# Patient Record
Sex: Female | Born: 1990 | Race: White | Hispanic: No | Marital: Single | Smoking: Never smoker
Health system: Southern US, Community
[De-identification: ages and names within clinical notes are randomized; demographics above are authoritative.]

## PROBLEM LIST (undated history)

## (undated) DIAGNOSIS — F32A Depression, unspecified: Secondary | ICD-10-CM

## (undated) DIAGNOSIS — F988 Other specified behavioral and emotional disorders with onset usually occurring in childhood and adolescence: Secondary | ICD-10-CM

## (undated) DIAGNOSIS — N289 Disorder of kidney and ureter, unspecified: Secondary | ICD-10-CM

## (undated) DIAGNOSIS — F419 Anxiety disorder, unspecified: Secondary | ICD-10-CM

## (undated) DIAGNOSIS — F329 Major depressive disorder, single episode, unspecified: Secondary | ICD-10-CM

## (undated) DIAGNOSIS — E079 Disorder of thyroid, unspecified: Secondary | ICD-10-CM

## (undated) HISTORY — PX: KIDNEY STONE SURGERY: SHX686

## (undated) HISTORY — PX: WISDOM TOOTH EXTRACTION: SHX21

## (undated) HISTORY — PX: CYSTOSCOPY KIDNEY W/ URETERAL GUIDE WIRE: SUR371

---
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
## 2016-02-02 ENCOUNTER — Emergency Department
Admission: EM | Admit: 2016-02-02 | Discharge: 2016-02-02 | Disposition: A | Discharge status: Home / Self Care | Payer: BLUE CROSS/BLUE SHIELD | Source: Home / Self Care | Attending: Family Medicine | Admitting: Family Medicine

## 2016-02-02 ENCOUNTER — Encounter: Payer: Self-pay | Admitting: Emergency Medicine

## 2016-02-02 DIAGNOSIS — N390 Urinary tract infection, site not specified: Secondary | ICD-10-CM | POA: Diagnosis not present

## 2016-02-02 DIAGNOSIS — M545 Low back pain, unspecified: Secondary | ICD-10-CM

## 2016-02-02 HISTORY — DX: Major depressive disorder, single episode, unspecified: F32.9

## 2016-02-02 HISTORY — DX: Depression, unspecified: F32.A

## 2016-02-02 LAB — POCT URINALYSIS DIP (MANUAL ENTRY)
BILIRUBIN UA: NEGATIVE
GLUCOSE UA: NEGATIVE
Ketones, POC UA: NEGATIVE
NITRITE UA: NEGATIVE
Protein Ur, POC: NEGATIVE
Spec Grav, UA: 1.02 (ref 1.005–1.03)
Urobilinogen, UA: NEGATIVE (ref 0–1)
pH, UA: 5.5 (ref 5–8)

## 2016-02-02 MED ORDER — NITROFURANTOIN MONOHYD MACRO 100 MG PO CAPS: 100.0000 mg | ORAL_CAPSULE | Freq: Two times a day (BID) | ORAL | Status: DC

## 2016-02-02 NOTE — ED Provider Notes (Signed)
CSN: [id number]     Arrival date & time 02/02/16  1914 History   First MD Initiated Contact with Patient 02/02/16 1937     Chief Complaint  Patient presents with  . Urinary Frequency      HPI Comments: Patient developed left lower back pain last night, with dysuria and frequency.  No fevers, chills, and sweats.  No abdominal pain.  Patient is a 25 y.o. female presenting with dysuria. The history is provided by the patient.  Dysuria Pain quality:  Aching Pain severity:  Mild Onset quality:  Sudden Duration:  1 day Timing:  Constant Progression:  Unchanged Chronicity:  New Relieved by:  Nothing Worsened by:  Nothing tried Ineffective treatments:  None tried Urinary symptoms: frequent urination and hesitancy   Urinary symptoms: no discolored urine, no foul-smelling urine, no hematuria and no bladder incontinence   Associated symptoms: no abdominal pain, no fever, no flank pain, no genital lesions, no nausea, no vaginal discharge and no vomiting     Past Medical History  Diagnosis Date  . Depressed    Past Surgical History  Procedure Laterality Date  . Kidney stone surgery     Family History  Problem Relation Age of Onset  . Diabetes Mother   . Hypertension Father    Social History  Substance Use Topics  . Smoking status: Never Smoker   . Smokeless tobacco: Never Used  . Alcohol Use: 1.8 oz/week    3 Glasses of wine per week   OB History    No data available     Review of Systems  Constitutional: Negative for fever.  Gastrointestinal: Negative for nausea, vomiting and abdominal pain.  Genitourinary: Positive for dysuria. Negative for flank pain and vaginal discharge.  Musculoskeletal: Positive for back pain.  Skin: Negative for rash.  All other systems reviewed and are negative.   Allergies  Penicillins and Septra  Home Medications   Prior to Admission medications   Medication Sig Start Date End Date Taking? Authorizing Provider  fexofenadine (ALLEGRA)  180 MG tablet Take 180 mg by mouth daily.   Yes Historical Provider, MD  levothyroxine (SYNTHROID, LEVOTHROID) 100 MCG tablet Take 100 mcg by mouth daily before breakfast.   Yes Historical Provider, MD  sertraline (ZOLOFT) 100 MG tablet Take 100 mg by mouth daily.   Yes Historical Provider, MD  nitrofurantoin, macrocrystal-monohydrate, (MACROBID) 100 MG capsule Take 1 capsule (100 mg total) by mouth 2 (two) times daily. Take with food. 02/02/16   Lattie Haw, MD   Meds Ordered and Administered this Visit  Medications - No data to display  BP 110/76 mmHg  Pulse 98  Temp(Src) 98.4 F (36.9 C) (Oral)  Wt 195 lb (88.451 kg)  SpO2 99% No data found.   Physical Exam  Constitutional: She is oriented to person, place, and time. She appears well-developed and well-nourished. No distress.  HENT:  Head: Normocephalic.  Nose: Nose normal.  Mouth/Throat: Oropharynx is clear and moist.  Eyes: Conjunctivae are normal. Pupils are equal, round, and reactive to light.  Neck: Neck supple.  Cardiovascular: Normal heart sounds.   Pulmonary/Chest: Breath sounds normal.  Abdominal: There is no tenderness. There is no CVA tenderness.  Musculoskeletal: She exhibits no edema.       Back:  Back is not tender to palpation; patient's area of pain is noted on diagram.  Lymphadenopathy:    She has no cervical adenopathy.  Neurological: She is alert and oriented to person, place, and time.  Skin: Skin is warm and dry. No rash noted.  Nursing note and vitals reviewed.   ED Course  Procedures none    Labs Reviewed  POCT URINALYSIS DIP (MANUAL ENTRY) - Abnormal; Notable for the following:    Blood, UA trace-intact (*)    Leukocytes, UA small (1+) (*)    All other components within normal limits  URINE CULTURE      MDM   1. Left-sided low back pain without sciatica   2. Urinary tract infection without hematuria, site unspecified    Urine culture pending. Begin Macrobid 100mg  BID for one  week. Increase fluid intake. If symptoms become significantly worse during the night or over the weekend, proceed to the local emergency room.  Followup with Family Doctor if not improved in one week.  May take Ibuprofen 200mg , 4 tabs every 8 hours with food for back ache. May use non-prescription AZO for about two days, if desired, to decrease urinary discomfort.     Lattie HawStephen A Ayham Word, MD 02/08/16 [phone]

## 2016-02-02 NOTE — Discharge Instructions (Signed)
May take Ibuprofen 200mg , 4 tabs every 8 hours with food for back ache. May use non-prescription AZO for about two days, if desired, to decrease urinary discomfort.  If symptoms become significantly worse during the night or over the weekend, proceed to the local emergency room.    Urinary Tract Infection Urinary tract infections (UTIs) can develop anywhere along your urinary tract. Your urinary tract is your body's drainage system for removing wastes and extra water. Your urinary tract includes two kidneys, two ureters, a bladder, and a urethra. Your kidneys are a pair of bean-shaped organs. Each kidney is about the size of your fist. They are located below your ribs, one on each side of your spine. CAUSES Infections are caused by microbes, which are microscopic organisms, including fungi, viruses, and bacteria. These organisms are so small that they can only be seen through a microscope. Bacteria are the microbes that most commonly cause UTIs. SYMPTOMS  Symptoms of UTIs may vary by age and gender of the patient and by the location of the infection. Symptoms in young women typically include a frequent and intense urge to urinate and a painful, burning feeling in the bladder or urethra during urination. Older women and men are more likely to be tired, shaky, and weak and have muscle aches and abdominal pain. A fever may mean the infection is in your kidneys. Other symptoms of a kidney infection include pain in your back or sides below the ribs, nausea, and vomiting. DIAGNOSIS To diagnose a UTI, your caregiver will ask you about your symptoms. Your caregiver will also ask you to provide a urine sample. The urine sample will be tested for bacteria and white blood cells. White blood cells are made by your body to help fight infection. TREATMENT  Typically, UTIs can be treated with medication. Because most UTIs are caused by a bacterial infection, they usually can be treated with the use of antibiotics.  The choice of antibiotic and length of treatment depend on your symptoms and the type of bacteria causing your infection. HOME CARE INSTRUCTIONS  If you were prescribed antibiotics, take them exactly as your caregiver instructs you. Finish the medication even if you feel better after you have only taken some of the medication.  Drink enough water and fluids to keep your urine clear or pale yellow.  Avoid caffeine, tea, and carbonated beverages. They tend to irritate your bladder.  Empty your bladder often. Avoid holding urine for long periods of time.  Empty your bladder before and after sexual intercourse.  After a bowel movement, women should cleanse from front to back. Use each tissue only once. SEEK MEDICAL CARE IF:   You have back pain.  You develop a fever.  Your symptoms do not begin to resolve within 3 days. SEEK IMMEDIATE MEDICAL CARE IF:   You have severe back pain or lower abdominal pain.  You develop chills.  You have nausea or vomiting.  You have continued burning or discomfort with urination. MAKE SURE YOU:   Understand these instructions.  Will watch your condition.  Will get help right away if you are not doing well or get worse.   This information is not intended to replace advice given to you by your health care provider. Make sure you discuss any questions you have with your health care provider.   Document Released: 06/05/2005 Document Revised: 05/17/2015 Document Reviewed: 10/04/2011 Elsevier Interactive Patient Education [employer]2016 Elsevier Inc.

## 2016-02-02 NOTE — ED Notes (Signed)
Pt c/o dysuria, frequency, and low back pain that started last night. Denies fever.

## 2016-02-04 LAB — URINE CULTURE
Colony Count: NO GROWTH
Organism ID, Bacteria: NO GROWTH

## 2016-02-05 ENCOUNTER — Telehealth: Payer: Self-pay | Admitting: Emergency Medicine

## 2016-02-05 NOTE — ED Notes (Signed)
Pt informed of negative urine culture, states that she is doing much better.  Pt will f/u as scheduled.  TMartin,CMA

## 2016-06-12 LAB — BASIC METABOLIC PANEL
BUN: 11 mg/dL (ref 4–21)
GLUCOSE: 88 mg/dL
Potassium: 4.1 mmol/L (ref 3.4–5.3)
Sodium: 143 mmol/L (ref 137–147)

## 2016-06-12 LAB — HEPATIC FUNCTION PANEL
ALT: 28 U/L (ref 7–35)
AST: 21 U/L (ref 13–35)
Alkaline Phosphatase: 100 U/L (ref 25–125)
Bilirubin, Total: 0.7 mg/dL

## 2016-06-14 ENCOUNTER — Emergency Department
Admission: EM | Admit: 2016-06-14 | Discharge: 2016-06-14 | Disposition: A | Discharge status: Home / Self Care | Payer: BLUE CROSS/BLUE SHIELD | Source: Home / Self Care | Attending: Family Medicine | Admitting: Family Medicine

## 2016-06-14 DIAGNOSIS — J029 Acute pharyngitis, unspecified: Secondary | ICD-10-CM

## 2016-06-14 LAB — POCT RAPID STREP A (OFFICE): Rapid Strep A Screen: NEGATIVE

## 2016-06-14 NOTE — ED Triage Notes (Signed)
Pt started with a sore throat last evening.  During the night she kept waking up with pain.  It has hurt most of the day today.  Had advil at 7:30 this morning and again at noon.

## 2016-06-14 NOTE — Discharge Instructions (Signed)

## 2016-06-14 NOTE — ED Provider Notes (Signed)
CSN: [id number]     Arrival date & time 06/14/16  1624 History   First MD Initiated Contact with Patient 06/14/16 1645     Chief Complaint  Patient presents with  . Sore Throat   (Consider location/radiation/quality/duration/timing/severity/associated sxs/prior Treatment) HPI  Lydia Gregory is a 25 y.o. female presenting to UC with c/o sore throat that started last night and kept waking her up throughout the night due to the pain.  Pain is aching, 4/10 at this time.  Worse with swallowing. 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  She took Advil at 7:30AM and again at noon today. Denies fever, chills, n/v/d, cough or congestion. No known sick contacts.  Past Medical History:  Diagnosis Date  . Depressed    Past Surgical History:  Procedure Laterality Date  . KIDNEY STONE SURGERY     Family History  Problem Relation Age of Onset  . Diabetes Mother   . Hypertension Mother   . Hypertension Father    Social History  Substance Use Topics  . Smoking status: Never Smoker  . Smokeless tobacco: Never Used  . Alcohol use 1.8 oz/week    3 Glasses of wine per week   OB History    No data available     Review of Systems  Constitutional: Negative for chills and fever.  HENT: Positive for sore throat. Negative for congestion, ear pain, trouble swallowing and voice change.   Respiratory: Negative for cough and shortness of breath.   Cardiovascular: Negative for chest pain and palpitations.  Gastrointestinal: Negative for abdominal pain, diarrhea, nausea and vomiting.  Musculoskeletal: Negative for arthralgias, back pain and myalgias.  Skin: Negative for rash.  Neurological: Positive for headaches. Negative for dizziness and light-headedness.    Allergies  Amoxicillin; Penicillins; and Septra [sulfamethoxazole-trimethoprim]  Home Medications   Prior to Admission medications   Medication Sig Start  Date End Date Taking? Authorizing Provider  fexofenadine (ALLEGRA) 180 MG tablet Take 180 mg by mouth daily.    Historical Provider, MD  levothyroxine (SYNTHROID, LEVOTHROID) 100 MCG tablet Take 100 mcg by mouth daily before breakfast.    Historical Provider, MD  nitrofurantoin, macrocrystal-monohydrate, (MACROBID) 100 MG capsule Take 1 capsule (100 mg total) by mouth 2 (two) times daily. Take with food. 02/02/16   Lattie HawStephen A Beese, MD  sertraline (ZOLOFT) 100 MG tablet Take 100 mg by mouth daily.    Historical Provider, MD   Meds Ordered and Administered this Visit  Medications - No data to display  BP 105/74 (BP Location: Left Arm)   Pulse 77   Temp 98 F (36.7 C) (Oral)   Ht 5\' 4"  (1.626 m)   Wt 187 lb (84.8 kg)   SpO2 99%   BMI 32.10 kg/m  No data found.   Physical Exam  Constitutional: She appears well-developed and well-nourished. No distress.  HENT:  Head: Normocephalic and atraumatic.  Right Ear: Tympanic membrane normal.  Left Ear: Tympanic membrane normal.  Nose: Nose normal.  Mouth/Throat: Uvula is midline and mucous membranes are normal. Posterior oropharyngeal erythema present.  Eyes: Conjunctivae are normal. No scleral icterus.  Neck: Normal range of motion.  Cardiovascular: Normal rate, regular rhythm and normal heart sounds.   Pulmonary/Chest: Effort normal and breath sounds normal. No stridor. No respiratory distress. She has no wheezes. She has no rales.  Musculoskeletal: Normal range of motion.  Lymphadenopathy:    She has no cervical adenopathy.  Neurological: She is alert.  Skin: Skin is warm and dry. She is not diaphoretic.  Nursing note and vitals reviewed.   Urgent Care Course   Clinical Course    Procedures (including critical care time)  Labs Review Labs Reviewed  STREP A DNA PROBE  POCT RAPID STREP A (OFFICE)    Imaging Review No results found.   MDM   1. Acute pharyngitis, unspecified etiology    Pt c/o sore throat that started  late last night, mild generalized headache as well. No other symptoms. Pt is a Runner, broadcasting/film/video.  Rapid strep: NEGATIVE Culture sent.  Symptoms likely viral. Encouraged fluids, rest, acetaminophen and ibuprofen. F/u with PCP in 7-10 days if not improving, sooner if worsening. Pt will be notified if Positive culture.    Junius Finner, PA-C 06/14/16 [phone]

## 2016-06-15 ENCOUNTER — Telehealth: Payer: Self-pay | Admitting: Emergency Medicine

## 2016-06-15 LAB — STREP A DNA PROBE: GASP: NOT DETECTED

## 2016-06-15 NOTE — Telephone Encounter (Signed)
Called.

## 2016-06-15 NOTE — Telephone Encounter (Signed)
Unable to leave a message on voice mail results were negative

## 2016-08-20 ENCOUNTER — Encounter: Payer: Self-pay | Admitting: Physician Assistant

## 2016-08-20 ENCOUNTER — Ambulatory Visit (INDEPENDENT_AMBULATORY_CARE_PROVIDER_SITE_OTHER): Payer: BLUE CROSS/BLUE SHIELD | Admitting: Physician Assistant

## 2016-08-20 VITALS — BP 129/83 | HR 97 | Ht 64.0 in | Wt 196.0 lb

## 2016-08-20 DIAGNOSIS — E039 Hypothyroidism, unspecified: Secondary | ICD-10-CM

## 2016-08-20 DIAGNOSIS — Z6833 Body mass index (BMI) 33.0-33.9, adult: Secondary | ICD-10-CM

## 2016-08-20 DIAGNOSIS — Z13220 Encounter for screening for lipoid disorders: Secondary | ICD-10-CM

## 2016-08-20 DIAGNOSIS — E559 Vitamin D deficiency, unspecified: Secondary | ICD-10-CM | POA: Insufficient documentation

## 2016-08-20 DIAGNOSIS — F411 Generalized anxiety disorder: Secondary | ICD-10-CM

## 2016-08-20 DIAGNOSIS — Z131 Encounter for screening for diabetes mellitus: Secondary | ICD-10-CM

## 2016-08-20 DIAGNOSIS — E6609 Other obesity due to excess calories: Secondary | ICD-10-CM | POA: Insufficient documentation

## 2016-08-20 DIAGNOSIS — F331 Major depressive disorder, recurrent, moderate: Secondary | ICD-10-CM

## 2016-08-20 MED ORDER — BUSPIRONE HCL 5 MG PO TABS: 5.0000 mg | ORAL_TABLET | Freq: Two times a day (BID) | ORAL | 0 refills | Status: DC

## 2016-08-20 MED ORDER — ALPRAZOLAM 0.5 MG PO TABS: 0.5000 mg | ORAL_TABLET | Freq: Two times a day (BID) | ORAL | 2 refills | Status: DC | PRN

## 2016-08-20 MED ORDER — LORCASERIN HCL 10 MG PO TABS: ORAL_TABLET | ORAL | 1 refills | Status: DC

## 2016-08-20 NOTE — Progress Notes (Signed)
Subjective:    Patient ID: Lydia Gregory, female    DOB: 11/29/1990, 25 y.o.   MRN: [id number]  HPI Pt is a 25 yo female who presents to establish care.   .. Active Ambulatory Problems    Diagnosis Date Noted  . Class 1 obesity due to excess calories without serious comorbidity with body mass index (BMI) of 33.0 to 33.9 in adult 08/20/2016  . Vitamin D deficiency 08/20/2016  . MDD (major depressive disorder), recurrent episode, moderate (HCC) 08/20/2016  . GAD (generalized anxiety disorder) 08/20/2016   Resolved Ambulatory Problems    Diagnosis Date Noted  . No Resolved Ambulatory Problems   Past Medical History:  Diagnosis Date  . Depressed    .Marland Kitchen. Family History  Problem Relation Age of Onset  . Diabetes Mother   . Hypertension Mother   . Hypertension Father    .Marland Kitchen. Social History   Social History  . Marital status: Single    Spouse name: N/A  . Number of children: N/A  . Years of education: N/A   Occupational History  . Not on file.   Social History Main Topics  . Smoking status: Never Smoker  . Smokeless tobacco: Never Used  . Alcohol use 1.8 oz/week    3 Glasses of wine per week  . Drug use: No  . Sexual activity: Not on file   Other Topics Concern  . Not on file   Social History Narrative  . 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 She feels like zoloft helps a lot but still not controlling anxiety. She denies any feelings of helplessness or hopelessness currently. She is not exercising or eating right. She would like to lose weight.    Review of Systems  All other systems reviewed and are negative.      Objective:   Physical Exam  Constitutional: She is oriented to person, place, and time. She appears well-developed and well-nourished.  Obesity.   HENT:  Head: Normocephalic and atraumatic.  Neck: Normal range of motion. Neck supple.  Cardiovascular: Normal rate, regular rhythm and  normal heart sounds.   Pulmonary/Chest: Effort normal and breath sounds normal.  Lymphadenopathy:    She has no cervical adenopathy.  Neurological: She is alert and oriented to person, place, and time.  Psychiatric: She has a normal mood and affect. Her behavior is normal.          Assessment & Plan:  Marland Kitchen.Marland Kitchen.Whitney PostLogan was seen today for establish care, hypothyroidism, depression and anxiety.  Diagnoses and all orders for this visit:  Hypothyroidism, unspecified type -     TSH  Vitamin D deficiency -     VITAMIN D 25 Hydroxy (Vit-D Deficiency, Fractures)  Class 1 obesity due to excess calories without serious comorbidity with body mass index (BMI) of 33.0 to 33.9 in adult -     Lorcaserin HCl 10 MG TABS; Take one tablet twice a day. -     Lipid panel -     COMPLETE METABOLIC PANEL WITH GFR  Screening for diabetes mellitus -     Lipid panel  Screening for lipid disorders -     COMPLETE METABOLIC PANEL WITH GFR  MDD (major depressive disorder), recurrent episode, moderate (HCC) -     busPIRone (BUSPAR) 5 MG tablet; Take 1 tablet (5 mg total) by mouth 2 (two) times daily.  GAD (generalized anxiety disorder) -     ALPRAZolam (  XANAX) 0.5 MG tablet; Take 1 tablet (0.5 mg total) by mouth 2 (two) times daily as needed for anxiety. -     busPIRone (BUSPAR) 5 MG tablet; Take 1 tablet (5 mg total) by mouth 2 (two) times daily.   PHQ-9 was 15. GAD-7 was 17.   Started buspar twice a day.  Xanax only has needed.  Continue on zoloft.   Phentermine is not an option due to issue with anxiety.  belviq started and side effects discussed. Follow up in 2 months.  Encouraged 150 minutes of exercise a week and 1500 calorie diet.

## 2016-08-22 ENCOUNTER — Telehealth: Payer: Self-pay | Admitting: *Deleted

## 2016-08-22 NOTE — Telephone Encounter (Signed)
Belviq PA submitted through [id number] - Rx #: [id number]

## 2016-08-27 NOTE — Telephone Encounter (Signed)
Received fax prior auth for Belviq needing more information I filled out form and faxed it back - CF

## 2016-08-27 NOTE — Telephone Encounter (Signed)
[facility] request for Belviq due to not meeting the requirements they did however deny this request before they received the faxed information so determination may be changed - CF

## 2016-09-15 ENCOUNTER — Other Ambulatory Visit: Payer: Self-pay | Admitting: Physician Assistant

## 2016-09-15 DIAGNOSIS — F331 Major depressive disorder, recurrent, moderate: Secondary | ICD-10-CM

## 2016-09-15 DIAGNOSIS — F411 Generalized anxiety disorder: Secondary | ICD-10-CM

## 2016-11-30 ENCOUNTER — Other Ambulatory Visit: Payer: Self-pay | Admitting: Physician Assistant

## 2016-11-30 DIAGNOSIS — F331 Major depressive disorder, recurrent, moderate: Secondary | ICD-10-CM

## 2016-11-30 DIAGNOSIS — F411 Generalized anxiety disorder: Secondary | ICD-10-CM

## 2016-12-31 ENCOUNTER — Ambulatory Visit (INDEPENDENT_AMBULATORY_CARE_PROVIDER_SITE_OTHER): Payer: BC Managed Care – PPO | Admitting: Physician Assistant

## 2016-12-31 ENCOUNTER — Encounter: Payer: Self-pay | Admitting: Physician Assistant

## 2016-12-31 VITALS — BP 111/74 | HR 78 | Ht 64.0 in | Wt 200.0 lb

## 2016-12-31 DIAGNOSIS — E039 Hypothyroidism, unspecified: Secondary | ICD-10-CM | POA: Diagnosis not present

## 2016-12-31 DIAGNOSIS — Z6833 Body mass index (BMI) 33.0-33.9, adult: Secondary | ICD-10-CM

## 2016-12-31 DIAGNOSIS — R41840 Attention and concentration deficit: Secondary | ICD-10-CM

## 2016-12-31 DIAGNOSIS — F411 Generalized anxiety disorder: Secondary | ICD-10-CM

## 2016-12-31 DIAGNOSIS — F331 Major depressive disorder, recurrent, moderate: Secondary | ICD-10-CM | POA: Diagnosis not present

## 2016-12-31 DIAGNOSIS — E6609 Other obesity due to excess calories: Secondary | ICD-10-CM | POA: Diagnosis not present

## 2016-12-31 DIAGNOSIS — F900 Attention-deficit hyperactivity disorder, predominantly inattentive type: Secondary | ICD-10-CM | POA: Diagnosis not present

## 2016-12-31 DIAGNOSIS — R5383 Other fatigue: Secondary | ICD-10-CM

## 2016-12-31 MED ORDER — LORCASERIN HCL 10 MG PO TABS: 1.0000 | ORAL_TABLET | Freq: Two times a day (BID) | ORAL | 1 refills | Status: DC

## 2016-12-31 MED ORDER — LEVOTHYROXINE SODIUM 150 MCG PO TABS: 150.0000 ug | ORAL_TABLET | Freq: Every day | ORAL | 5 refills | Status: DC

## 2016-12-31 MED ORDER — SERTRALINE HCL 100 MG PO TABS
100.0000 mg | ORAL_TABLET | Freq: Every day | ORAL | 5 refills | Status: DC
Start: 2016-12-31 — End: 2017-04-21

## 2016-12-31 MED ORDER — AMPHETAMINE-DEXTROAMPHET ER 20 MG PO CP24: 20.0000 mg | ORAL_CAPSULE | ORAL | 0 refills | Status: DC

## 2016-12-31 NOTE — Progress Notes (Signed)
   Subjective:    Patient ID: Lydia Gregory, female    DOB: Jan 02, 1991, 26 y.o.   MRN: [id number]  HPI Pt is a 26 yo obese female who presents to the clinic to follow up on weight. She was not able to get the belviq because not approved by insurance. She reports she is doing weight watchers and exercising 4-5 times a week with no results. infact she has gained 4lbs in 3 months. She is very frustrated. She does have a strong family history of thyroid issues in her family and she is on levothyroxine.   She is also frustrated with her inattention. Per patient she was on stimulant medication in high school and college but then stopped. She has formal testing done. 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 She feels like it wears her out during the day to have to think so much. She really struggles with energy. She sleeps well at night and wakes up feeling rested.    Review of Systems  All other systems reviewed and are negative.      Objective:   Physical Exam  Constitutional: She is oriented to person, place, and time. She appears well-developed and well-nourished.  Obese.   HENT:  Head: Normocephalic and atraumatic.  Neck: Normal range of motion. Neck supple. No thyromegaly present.  Cardiovascular: Normal rate, regular rhythm and normal heart sounds.   Pulmonary/Chest: Effort normal and breath sounds normal. She has no wheezes.  Neurological: She is alert and oriented to person, place, and time.  Psychiatric: She has a normal mood and affect. Her behavior is normal.          Assessment & Plan:  Marland KitchenMarland KitchenDiagnoses and all orders for this visit:  GAD (generalized anxiety disorder) -     sertraline (ZOLOFT) 100 MG tablet; Take 1 tablet (100 mg total) by mouth daily.  No energy -     CBC -     Comprehensive metabolic panel -     C-reactive protein -     Ferritin -     Sedimentation rate -     TSH -     Vitamin B12 -      Vit D  25 hydroxy (rtn osteoporosis monitoring)  MDD (major depressive disorder), recurrent episode, moderate (HCC) -     sertraline (ZOLOFT) 100 MG tablet; Take 1 tablet (100 mg total) by mouth daily.  Hypothyroidism, unspecified type -     levothyroxine (SYNTHROID, LEVOTHROID) 150 MCG tablet; Take 1 tablet (150 mcg total) by mouth daily.  Inattention  Attention deficit hyperactivity disorder (ADHD), predominantly inattentive type -     amphetamine-dextroamphetamine (ADDERALL XR) 20 MG 24 hr capsule; Take 1 capsule (20 mg total) by mouth every morning.  Class 1 obesity due to excess calories without serious comorbidity with body mass index (BMI) of 33.0 to 33.9 in adult -     Lorcaserin HCl 10 MG TABS; Take 1 tablet by mouth 2 (two) times daily.     Tried another 3 months of diet and exercise weight watces 3-4 times a week. Gained 4lbs. Will attempt to get belviq approved.   Will start adderall. Follow up in one month. Try to find copy of formal testing.   Will make sure with labs no metabolic reason for fatigue.

## 2017-01-02 ENCOUNTER — Telehealth: Payer: Self-pay | Admitting: *Deleted

## 2017-01-02 ENCOUNTER — Telehealth: Payer: Self-pay | Admitting: Physician Assistant

## 2017-01-02 ENCOUNTER — Encounter: Payer: Self-pay | Admitting: Physician Assistant

## 2017-01-02 DIAGNOSIS — R41840 Attention and concentration deficit: Secondary | ICD-10-CM | POA: Insufficient documentation

## 2017-01-02 DIAGNOSIS — E039 Hypothyroidism, unspecified: Secondary | ICD-10-CM | POA: Insufficient documentation

## 2017-01-02 DIAGNOSIS — R5383 Other fatigue: Secondary | ICD-10-CM | POA: Insufficient documentation

## 2017-01-02 DIAGNOSIS — F900 Attention-deficit hyperactivity disorder, predominantly inattentive type: Secondary | ICD-10-CM | POA: Insufficient documentation

## 2017-01-02 NOTE — Telephone Encounter (Signed)
Can we submit new information for belviq to see if it could be covered. She is not a candidate for phentermine due to her anxiety and she has gained 4lbs in 3 months with weight watchers and exericsing 4-5 times a week for at least 30 minutes.

## 2017-01-02 NOTE — Telephone Encounter (Signed)
Pre Authorization sent to cover my meds.[id number]

## 2017-01-02 NOTE — Telephone Encounter (Signed)
See note 01/02/17

## 2017-01-07 NOTE — Telephone Encounter (Signed)
Received fax from [facility] and Sharyn Creamer is approved from 01/02/17 - 04/03/17 as long as member stays on health plan.   PA# [facility] Plan [id number] Non-Grandfathered [id number] CL

## 2017-01-10 ENCOUNTER — Other Ambulatory Visit: Payer: Self-pay | Admitting: Physician Assistant

## 2017-01-10 DIAGNOSIS — F411 Generalized anxiety disorder: Secondary | ICD-10-CM

## 2017-01-10 DIAGNOSIS — F331 Major depressive disorder, recurrent, moderate: Secondary | ICD-10-CM

## 2017-02-06 ENCOUNTER — Other Ambulatory Visit: Payer: Self-pay | Admitting: *Deleted

## 2017-02-06 DIAGNOSIS — F900 Attention-deficit hyperactivity disorder, predominantly inattentive type: Secondary | ICD-10-CM

## 2017-02-06 MED ORDER — AMPHETAMINE-DEXTROAMPHET ER 20 MG PO CP24: 20.0000 mg | ORAL_CAPSULE | ORAL | 0 refills | Status: DC

## 2017-02-10 ENCOUNTER — Other Ambulatory Visit: Payer: Self-pay | Admitting: Physician Assistant

## 2017-02-10 DIAGNOSIS — F411 Generalized anxiety disorder: Secondary | ICD-10-CM

## 2017-02-10 DIAGNOSIS — F331 Major depressive disorder, recurrent, moderate: Secondary | ICD-10-CM

## 2017-03-24 LAB — COMPREHENSIVE METABOLIC PANEL
ALT: 19 U/L (ref 6–29)
AST: 14 U/L (ref 10–30)
Albumin: 4.2 g/dL (ref 3.6–5.1)
Alkaline Phosphatase: 78 U/L (ref 33–115)
BUN: 11 mg/dL (ref 7–25)
CHLORIDE: 107 mmol/L (ref 98–110)
CO2: 23 mmol/L (ref 20–31)
Calcium: 9.1 mg/dL (ref 8.6–10.2)
Creat: 0.75 mg/dL (ref 0.50–1.10)
GLUCOSE: 86 mg/dL (ref 65–99)
Potassium: 4.2 mmol/L (ref 3.5–5.3)
Sodium: 141 mmol/L (ref 135–146)
Total Bilirubin: 0.7 mg/dL (ref 0.2–1.2)
Total Protein: 6.4 g/dL (ref 6.1–8.1)

## 2017-03-24 LAB — CBC
HCT: 37.6 % (ref 35.0–45.0)
Hemoglobin: 12.4 g/dL (ref 11.7–15.5)
MCH: 29.7 pg (ref 27.0–33.0)
MCHC: 33 g/dL (ref 32.0–36.0)
MCV: 90 fL (ref 80.0–100.0)
MPV: 9.7 fL (ref 7.5–12.5)
PLATELETS: 241 10*3/uL (ref 140–400)
RBC: 4.18 MIL/uL (ref 3.80–5.10)
RDW: 13.7 % (ref 11.0–15.0)
WBC: 8.3 10*3/uL (ref 3.8–10.8)

## 2017-03-24 LAB — TSH: TSH: 5.58 mIU/L — ABNORMAL HIGH

## 2017-03-25 LAB — FERRITIN: Ferritin: 33 ng/mL (ref 10–154)

## 2017-03-25 LAB — VITAMIN B12: VITAMIN B 12: 341 pg/mL (ref 200–1100)

## 2017-03-25 LAB — C-REACTIVE PROTEIN: CRP: 1.8 mg/L (ref <8.0)

## 2017-03-25 LAB — SEDIMENTATION RATE: Sed Rate: 8 mm/hr (ref 0–20)

## 2017-03-25 LAB — VITAMIN D 25 HYDROXY (VIT D DEFICIENCY, FRACTURES): Vit D, 25-Hydroxy: 24 ng/mL — ABNORMAL LOW (ref 30–100)

## 2017-03-27 ENCOUNTER — Other Ambulatory Visit: Payer: Self-pay

## 2017-03-27 DIAGNOSIS — E039 Hypothyroidism, unspecified: Secondary | ICD-10-CM

## 2017-03-27 MED ORDER — VITAMIN D (ERGOCALCIFEROL) 1.25 MG (50000 UNIT) PO CAPS: 50000.0000 [IU] | ORAL_CAPSULE | ORAL | 0 refills | Status: DC

## 2017-03-27 MED ORDER — LEVOTHYROXINE SODIUM 175 MCG PO TABS: 175.0000 ug | ORAL_TABLET | Freq: Every day | ORAL | 3 refills | Status: DC

## 2017-03-27 NOTE — Addendum Note (Signed)
Addended by: Monica BectonHEKKEKANDAM, THOMAS J on: 03/27/2017 04:39 PM   Modules accepted: Orders

## 2017-03-27 NOTE — Assessment & Plan Note (Signed)
Vitamin D is low, TSH is elevated, calling in supplemental vitamin D, we also need to increase levothyroxine dose.  Recheck TSH in 6 weeks.

## 2017-04-14 ENCOUNTER — Encounter: Payer: Self-pay | Admitting: Physician Assistant

## 2017-04-21 ENCOUNTER — Encounter: Payer: Self-pay | Admitting: Physician Assistant

## 2017-04-21 ENCOUNTER — Ambulatory Visit (INDEPENDENT_AMBULATORY_CARE_PROVIDER_SITE_OTHER): Payer: BC Managed Care – PPO | Admitting: Physician Assistant

## 2017-04-21 VITALS — BP 117/84 | HR 101 | Ht 64.0 in | Wt 200.0 lb

## 2017-04-21 DIAGNOSIS — E6609 Other obesity due to excess calories: Secondary | ICD-10-CM | POA: Diagnosis not present

## 2017-04-21 DIAGNOSIS — F411 Generalized anxiety disorder: Secondary | ICD-10-CM | POA: Diagnosis not present

## 2017-04-21 DIAGNOSIS — F900 Attention-deficit hyperactivity disorder, predominantly inattentive type: Secondary | ICD-10-CM | POA: Diagnosis not present

## 2017-04-21 DIAGNOSIS — Z6833 Body mass index (BMI) 33.0-33.9, adult: Secondary | ICD-10-CM

## 2017-04-21 MED ORDER — SERTRALINE HCL 100 MG PO TABS: 200.0000 mg | ORAL_TABLET | Freq: Every day | ORAL | 5 refills | Status: DC

## 2017-04-21 MED ORDER — ALPRAZOLAM 0.5 MG PO TABS: 0.5000 mg | ORAL_TABLET | Freq: Two times a day (BID) | ORAL | 2 refills | Status: DC | PRN

## 2017-04-21 MED ORDER — AMPHETAMINE-DEXTROAMPHET ER 20 MG PO CP24: 20.0000 mg | ORAL_CAPSULE | ORAL | 0 refills | Status: DC

## 2017-04-21 NOTE — Progress Notes (Signed)
Subjective:    Patient ID: Lydia Gregory, female    DOB: [birth year], 26 y.o.   MRN: [id number]  HPI Pt is a 26 yo female who presents to the clinic for 3 month follow up.   Obesity- she stopped belviq because she was not watching what she eats or working out. She did not really feel like it was helping.   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 It helps her to stay productive and to decrease stress because she is getting work done.   GAd- much better this summer. She needs refill on zoloft. Doing well with no complaints.   .. Active Ambulatory Problems    Diagnosis Date Noted  . Class 1 obesity due to excess calories without serious comorbidity with body mass index (BMI) of 33.0 to 33.9 in adult 08/20/2016  . Vitamin D deficiency 08/20/2016  . MDD (major depressive disorder), recurrent episode, moderate (HCC) 08/20/2016  . GAD (generalized anxiety disorder) 08/20/2016  . No energy 01/02/2017  . Hypothyroidism 01/02/2017  . Inattention 01/02/2017  . Attention deficit hyperactivity disorder (ADHD), predominantly inattentive type 01/02/2017   Resolved Ambulatory Problems    Diagnosis Date Noted  . No Resolved Ambulatory Problems   Past Medical History:  Diagnosis Date  . Depressed        Review of Systems  All other systems reviewed and are negative.      Objective:   Physical Exam  Constitutional: She is oriented to person, place, and time. She appears well-developed and well-nourished.  HENT:  Head: Normocephalic and atraumatic.  Cardiovascular: Normal rate, regular rhythm and normal heart sounds.   Pulmonary/Chest: Effort normal and breath sounds normal.  Neurological: She is alert and oriented to person, place, and time.  Psychiatric: She has a normal mood and affect. Her behavior is normal.          Assessment & Plan:  Marland KitchenMarland KitchenKianni was seen today for follow-up.  Diagnoses and all orders for this visit:  Class 1 obesity  due to excess calories without serious comorbidity with body mass index (BMI) of 33.0 to 33.9 in adult  Attention deficit hyperactivity disorder (ADHD), predominantly inattentive type -     Discontinue: amphetamine-dextroamphetamine (ADDERALL XR) 20 MG 24 hr capsule; Take 1 capsule (20 mg total) by mouth every morning. -     Discontinue: amphetamine-dextroamphetamine (ADDERALL XR) 20 MG 24 hr capsule; Take 1 capsule (20 mg total) by mouth every morning. -     amphetamine-dextroamphetamine (ADDERALL XR) 20 MG 24 hr capsule; Take 1 capsule (20 mg total) by mouth every morning.  GAD (generalized anxiety disorder) -     ALPRAZolam (XANAX) 0.5 MG tablet; Take 1 tablet (0.5 mg total) by mouth 2 (two) times daily as needed for anxiety.  Other orders -     sertraline (ZOLOFT) 100 MG tablet; Take 2 tablets (200 mg total) by mouth daily.  .. GAD 7 : Generalized Anxiety Score 04/21/2017 08/20/2016  Nervous, Anxious, on Edge 0 3  Control/stop worrying 0 2  Worry too much - different things 0 3  Trouble relaxing 0 3  Restless 0 3  Easily annoyed or irritable 1 3  Afraid - awful might happen 0 0  Total GAD 7 Score 1 17  Anxiety Difficulty Not difficult at all Very difficult    Discussed other options for weight loss. Discussed saxenda. Pt declines today. She would like to start exercising first.   Refilled 3 months  adderall.

## 2017-04-21 NOTE — Patient Instructions (Signed)
Liraglutide injection (Weight Management) What is this medicine? LIRAGLUTIDE (LIR a GLOO tide) is used with a reduced calorie diet and exercise to help you lose weight. This medicine may be used for other purposes; ask your health care provider or pharmacist if you have questions. COMMON BRAND NAME(S): Saxenda What should I tell my health care provider before I take this medicine? They need to know if you have any of these conditions: -endocrine tumors (MEN 2) or if someone in your family had these tumors -gallbladder disease -high cholesterol -history of alcohol abuse problem -history of pancreatitis -kidney disease or if you are on dialysis -liver disease -previous swelling of the tongue, face, or lips with difficulty breathing, difficulty swallowing, hoarseness, or tightening of the throat -stomach problems -suicidal thoughts, plans, or attempt; a previous suicide attempt by you or a family member -thyroid cancer or if someone in your family had thyroid cancer -an unusual or allergic reaction to liraglutide, other medicines, foods, dyes, or preservatives -pregnant or trying to get pregnant -breast-feeding How should I use this medicine? This medicine is for injection under the skin of your upper leg, stomach area, or upper arm. You will be taught how to prepare and give this medicine. Use exactly as directed. Take your medicine at regular intervals. Do not take it more often than directed. It is important that you put your used needles and syringes in a special sharps container. Do not put them in a trash can. If you do not have a sharps container, call your pharmacist or healthcare provider to get one. A special MedGuide will be given to you by the pharmacist with each prescription and refill. Be sure to read this information carefully each time. Talk to your pediatrician regarding the use of this medicine in children. Special care may be needed. Overdosage: If you think you have taken  too much of this medicine contact a poison control center or emergency room at once. NOTE: This medicine is only for you. Do not share this medicine with others. What if I miss a dose? If you miss a dose, take it as soon as you can. If it is almost time for your next dose, take only that dose. Do not take double or extra doses. If you miss your dose for 3 days or more, call your doctor or health care professional to talk about how to restart this medicine. What may interact with this medicine? -insulin and other medicines for diabetes This list may not describe all possible interactions. Give your health care provider a list of all the medicines, herbs, non-prescription drugs, or dietary supplements you use. Also tell them if you smoke, drink alcohol, or use illegal drugs. Some items may interact with your medicine. What should I watch for while using this medicine? Visit your doctor or health care professional for regular checks on your progress. This medicine is intended to be used in addition to a healthy diet and appropriate exercise. The best results are achieved this way. Do not increase or in any way change your dose without consulting your doctor or health care professional. Drink plenty of fluids while taking this medicine. Check with your doctor or health care professional if you get an attack of severe diarrhea, nausea, and vomiting. The loss of too much body fluid can make it dangerous for you to take this medicine. This medicine may affect blood sugar levels. If you have diabetes, check with your doctor or health care professional before you change your diet   or the dose of your diabetic medicine. Patients and their families should watch out for worsening depression or thoughts of suicide. Also watch out for sudden changes in feelings such as feeling anxious, agitated, panicky, irritable, hostile, aggressive, impulsive, severely restless, overly excited and hyperactive, or not being able to  sleep. If this happens, especially at the beginning of treatment or after a change in dose, call your health care professional. What side effects may I notice from receiving this medicine? Side effects that you should report to your doctor or health care professional as soon as possible: -allergic reactions like skin rash, itching or hives, swelling of the face, lips, or tongue -breathing problems -diarrhea that continues or is severe -lump or swelling on the neck -severe nausea -signs and symptoms of infection like fever or chills; cough; sore throat; pain or trouble passing urine -signs and symptoms of low blood sugar such as feeling anxious, confusion, dizziness, increased hunger, unusually weak or tired, sweating, shakiness, cold, irritable, headache, blurred vision, fast heartbeat, loss of consciousness -signs and symptoms of kidney injury like trouble passing urine or change in the amount of urine -trouble swallowing -unusual stomach upset or pain -vomiting Side effects that usually do not require medical attention (report to your doctor or health care professional if they continue or are bothersome): -constipation -decreased appetite -diarrhea -fatigue -headache -nausea -pain, redness, or irritation at site where injected -stomach upset -stuffy or runny nose This list may not describe all possible side effects. Call your doctor for medical advice about side effects. You may report side effects to FDA at 1-800-FDA-1088. Where should I keep my medicine? Keep out of the reach of children. Store unopened pen in a refrigerator between 2 and 8 degrees C (36 and 46 degrees F). Do not freeze or use if the medicine has been frozen. Protect from light and excessive heat. After you first use the pen, it can be stored at room temperature between 15 and 30 degrees C (59 and 86 degrees F) or in a refrigerator. Throw away your used pen after 30 days or after the expiration date, whichever comes  first. Do not store your pen with the needle attached. If the needle is left on, medicine may leak from the pen. NOTE: This sheet is a summary. It may not cover all possible information. If you have questions about this medicine, talk to your doctor, pharmacist, or health care provider.  2018 Elsevier/Gold Standard (2016-09-12 14:41:37)  

## 2017-04-30 ENCOUNTER — Encounter: Payer: Self-pay | Admitting: *Deleted

## 2017-04-30 ENCOUNTER — Emergency Department
Admission: EM | Admit: 2017-04-30 | Discharge: 2017-04-30 | Disposition: A | Discharge status: Home / Self Care | Payer: BC Managed Care – PPO | Source: Home / Self Care | Attending: Family Medicine | Admitting: Family Medicine

## 2017-04-30 DIAGNOSIS — H6692 Otitis media, unspecified, left ear: Secondary | ICD-10-CM

## 2017-04-30 DIAGNOSIS — J029 Acute pharyngitis, unspecified: Secondary | ICD-10-CM

## 2017-04-30 MED ORDER — AZITHROMYCIN 250 MG PO TABS: 250.0000 mg | ORAL_TABLET | Freq: Every day | ORAL | 0 refills | Status: DC

## 2017-04-30 NOTE — ED Provider Notes (Signed)
[facility]    CSN: [id number] Arrival date & time: 04/30/17  3567     History   Chief Complaint Chief Complaint  Patient presents with  . Otalgia  . Sore Throat    HPI Lydia Gregory is a 26 y.o. female.   HPI  Lydia Gregory is a 26 y.o. female presenting to UC with c/o Left ear pain, diffuse headache, and sore throat with body aches that started yesterday but she has had mild nasal congestion for about 1 week.  Ear pain is most bothersome for pt. Pain is aching, 4/10. She took Nyquil last night with mild relief. Denies fever, chills, n/v/d. She notes a friend had strep recently but she did not see the friend until after the friend had been on antibiotics.   Allergic to PCN and Amoxicillin- hives, unsure if she has ever had cefdinir or cephalexin.    Past Medical History:  Diagnosis Date  . Depressed     Patient Active Problem List   Diagnosis Date Noted  . No energy 01/02/2017  . Hypothyroidism 01/02/2017  . Inattention 01/02/2017  . Attention deficit hyperactivity disorder (ADHD), predominantly inattentive type 01/02/2017  . Class 1 obesity due to excess calories without serious comorbidity with body mass index (BMI) of 33.0 to 33.9 in adult 08/20/2016  . Vitamin D deficiency 08/20/2016  . MDD (major depressive disorder), recurrent episode, moderate (HCC) 08/20/2016  . GAD (generalized anxiety disorder) 08/20/2016    Past Surgical History:  Procedure Laterality Date  . KIDNEY STONE SURGERY    . WISDOM TOOTH EXTRACTION      OB History    No data available       Home Medications    Prior to Admission medications   Medication Sig Start Date End Date Taking? Authorizing Provider  ALPRAZolam Prudy Feeler) 0.5 MG tablet Take 1 tablet (0.5 mg total) by mouth 2 (two) times daily as needed for anxiety. 04/21/17 04/21/18 Yes Breeback, Jade L, PA-C  levothyroxine (SYNTHROID, LEVOTHROID) 175 MCG tablet Take 1 tablet (175 mcg total) by mouth daily. 03/27/17   Yes Monica Becton, MD  sertraline (ZOLOFT) 100 MG tablet Take 2 tablets (200 mg total) by mouth daily. 04/21/17  Yes Breeback, Jade L, PA-C  amphetamine-dextroamphetamine (ADDERALL XR) 20 MG 24 hr capsule Take 1 capsule (20 mg total) by mouth every morning. 04/21/17   Breeback, Jade L, PA-C  azithromycin (ZITHROMAX) 250 MG tablet Take 1 tablet (250 mg total) by mouth daily. Take first 2 tablets together, then 1 every day until finished. 04/30/17   Lurene Shadow, PA-C  busPIRone (BUSPAR) 5 MG tablet TAKE 1 TABLET BY MOUTH TWICE A DAY 02/10/17   Breeback, Jade L, PA-C  fexofenadine (ALLEGRA) 180 MG tablet Take 180 mg by mouth daily.    [provider]  Vitamin D, Ergocalciferol, (DRISDOL) 50000 units CAPS capsule Take 1 capsule (50,000 Units total) by mouth every 7 (seven) days. Take for 8 total doses(weeks) 03/27/17   Monica Becton, MD    Family History Family History  Problem Relation Age of Onset  . Diabetes Mother   . Hypertension Mother   . Hypertension Father     Social History Social History  Substance Use Topics  . Smoking status: Never Smoker  . Smokeless tobacco: Never Used  . Alcohol use 1.8 oz/week    3 Glasses of wine per week     Allergies   Amoxicillin; Penicillins; and Septra [sulfamethoxazole-trimethoprim]   Review of Systems Review  of Systems  Constitutional: Negative for chills and fever.  HENT: Positive for congestion, ear pain (Left), rhinorrhea, sinus pressure and sore throat. Negative for trouble swallowing and voice change.   Respiratory: Positive for cough ( minimal). Negative for shortness of breath.   Cardiovascular: Negative for chest pain and palpitations.  Gastrointestinal: Negative for abdominal pain, diarrhea, nausea and vomiting.  Musculoskeletal: Negative for arthralgias, back pain and myalgias.  Skin: Negative for rash.  Neurological: Positive for headaches. Negative for dizziness and light-headedness.     Physical  Exam Triage Vital Signs ED Triage Vitals  Enc Vitals Group     BP 04/30/17 0837 128/86     Pulse Rate 04/30/17 0837 99     Resp 04/30/17 0837 16     Temp 04/30/17 0837 99.9 F (37.7 C)     Temp Source 04/30/17 0837 Oral     SpO2 04/30/17 0837 100 %     Weight 04/30/17 0838 204 lb (92.5 kg)     Height 04/30/17 0838 5\' 4"  (1.626 m)     Head Circumference --      Peak Flow --      Pain Score 04/30/17 0838 4     Pain Loc --      Pain Edu? --      Excl. in GC? --    No data found.   Updated Vital Signs BP 128/86 (BP Location: Left Arm)   Pulse 99   Temp 99.9 F (37.7 C) (Oral)   Resp 16   Ht 5\' 4"  (1.626 m)   Wt 204 lb (92.5 kg)   SpO2 100%   BMI 35.02 kg/m   Visual Acuity Right Eye Distance:   Left Eye Distance:   Bilateral Distance:    Right Eye Near:   Left Eye Near:    Bilateral Near:     Physical Exam  Constitutional: She is oriented to person, place, and time. She appears well-developed and well-nourished. No distress.  HENT:  Head: Normocephalic and atraumatic.  Right Ear: Tympanic membrane normal.  Left Ear: No swelling or tenderness. Tympanic membrane is erythematous. Tympanic membrane is not bulging.  Nose: Mucosal edema present. Right sinus exhibits no maxillary sinus tenderness and no frontal sinus tenderness. Left sinus exhibits no maxillary sinus tenderness and no frontal sinus tenderness.  Mouth/Throat: Uvula is midline and mucous membranes are normal. Posterior oropharyngeal erythema present. No oropharyngeal exudate, posterior oropharyngeal edema or tonsillar abscesses.  Eyes: EOM are normal.  Neck: Normal range of motion. Neck supple.  Cardiovascular: Normal rate and regular rhythm.   Pulmonary/Chest: Effort normal and breath sounds normal. No stridor. No respiratory distress. She has no wheezes. She has no rales.  Musculoskeletal: Normal range of motion.  Lymphadenopathy:    She has no cervical adenopathy.  Neurological: She is alert and  oriented to person, place, and time.  Skin: Skin is warm and dry. She is not diaphoretic.  Psychiatric: She has a normal mood and affect. Her behavior is normal.  Nursing note and vitals reviewed.    UC Treatments / Results  Labs (all labs ordered are listed, but only abnormal results are displayed) Labs Reviewed - No data to display  EKG  EKG Interpretation None       Radiology No results found.  Procedures Procedures (including critical care time)  Medications Ordered in UC Medications - No data to display   Initial Impression / Assessment and Plan / UC Course  I have reviewed the triage vital  signs and the nursing notes.  Pertinent labs & imaging results that were available during my care of the patient were reviewed by me and considered in my medical decision making (see chart for details).     Hx and exam c/w early Left AOM  Final Clinical Impressions(s) / UC Diagnoses   Final diagnoses:  Left acute otitis media  Sore throat   Home care instructions provided Encouraged f/u with PCP in 1 week if not improving.   New Prescriptions New Prescriptions   AZITHROMYCIN (ZITHROMAX) 250 MG TABLET    Take 1 tablet (250 mg total) by mouth daily. Take first 2 tablets together, then 1 every day until finished.     Controlled Substance Prescriptions [place] Controlled Substance Registry consulted? Not Applicable   Rolla Plate 04/30/17 [phone]

## 2017-04-30 NOTE — Discharge Instructions (Signed)
°  You may take 500mg acetaminophen every 4-6 hours or in combination with ibuprofen 400-600mg every 6-8 hours as needed for pain, inflammation, and fever. ° °Be sure to drink at least eight 8oz glasses of water to stay well hydrated and get at least 8 hours of sleep at night, preferably more while sick.  ° °Please take antibiotics as prescribed and be sure to complete entire course even if you start to feel better to ensure infection does not come back. ° °

## 2017-04-30 NOTE — ED Triage Notes (Signed)
Pt c/o LT ear pain, sore throat, HA and body aches x yesterday morning. Denies fever.

## 2017-05-19 ENCOUNTER — Emergency Department
Admission: EM | Admit: 2017-05-19 | Discharge: 2017-05-19 | Disposition: A | Discharge status: Home / Self Care | Payer: BC Managed Care – PPO | Source: Home / Self Care | Attending: Family Medicine | Admitting: Family Medicine

## 2017-05-19 ENCOUNTER — Encounter: Payer: Self-pay | Admitting: *Deleted

## 2017-05-19 DIAGNOSIS — H9202 Otalgia, left ear: Secondary | ICD-10-CM | POA: Diagnosis not present

## 2017-05-19 MED ORDER — PREDNISONE 20 MG PO TABS: ORAL_TABLET | ORAL | 0 refills | Status: DC

## 2017-05-19 NOTE — ED Triage Notes (Signed)
Pt reports that her LT ear pain improved minimally with Zpak, but it has not completely resolved. Last dose IBF at 1315 today. Temp 99.8 earlier today.

## 2017-05-19 NOTE — Discharge Instructions (Signed)
May add Pseudoephedrine (30mg, one or two every 4 to 6 hours) for sinus congestion.    

## 2017-05-19 NOTE — ED Provider Notes (Signed)
[facility]    CSN: [id number] Arrival date & time: 05/19/17  1606     History   Chief Complaint Chief Complaint  Patient presents with  . Otalgia    HPI Lydia Gregory is a 26 y.o. female.   Patient was recently treated for left otitis media and pharyngitis with a Z-pak.  She reports that all symptoms resolved except for persistent mild soreness in left ear.  No fevers, chills, and sweats    The history is provided by the patient.    Past Medical History:  Diagnosis Date  . Depressed     Patient Active Problem List   Diagnosis Date Noted  . No energy 01/02/2017  . Hypothyroidism 01/02/2017  . Inattention 01/02/2017  . Attention deficit hyperactivity disorder (ADHD), predominantly inattentive type 01/02/2017  . Class 1 obesity due to excess calories without serious comorbidity with body mass index (BMI) of 33.0 to 33.9 in adult 08/20/2016  . Vitamin D deficiency 08/20/2016  . MDD (major depressive disorder), recurrent episode, moderate (HCC) 08/20/2016  . GAD (generalized anxiety disorder) 08/20/2016    Past Surgical History:  Procedure Laterality Date  . KIDNEY STONE SURGERY    . WISDOM TOOTH EXTRACTION      OB History    No data available       Home Medications    Prior to Admission medications   Medication Sig Start Date End Date Taking? Authorizing Provider  ALPRAZolam Prudy Feeler(XANAX) 0.5 MG tablet Take 1 tablet (0.5 mg total) by mouth 2 (two) times daily as needed for anxiety. 04/21/17 04/21/18  Breeback, Lonna CobbJade L, PA-C  amphetamine-dextroamphetamine (ADDERALL XR) 20 MG 24 hr capsule Take 1 capsule (20 mg total) by mouth every morning. 04/21/17   Breeback, Jade L, PA-C  busPIRone (BUSPAR) 5 MG tablet TAKE 1 TABLET BY MOUTH TWICE A DAY 02/10/17   Breeback, Jade L, PA-C  fexofenadine (ALLEGRA) 180 MG tablet Take 180 mg by mouth daily.    [provider]  levothyroxine (SYNTHROID, LEVOTHROID) 175 MCG tablet Take 1 tablet (175 mcg total) by mouth  daily. 03/27/17   Monica Bectonhekkekandam, Thomas J, MD  predniSONE (DELTASONE) 20 MG tablet Take one tab by mouth twice daily for 4 days, then one daily for 3 days. Take with food. 05/19/17   Lattie HawBeese, Essence Merle A, MD  sertraline (ZOLOFT) 100 MG tablet Take 2 tablets (200 mg total) by mouth daily. 04/21/17   Breeback, Lonna CobbJade L, PA-C  Vitamin D, Ergocalciferol, (DRISDOL) 50000 units CAPS capsule Take 1 capsule (50,000 Units total) by mouth every 7 (seven) days. Take for 8 total doses(weeks) 03/27/17   Monica Bectonhekkekandam, Thomas J, MD    Family History Family History  Problem Relation Age of Onset  . Diabetes Mother   . Hypertension Mother   . Hypertension Father     Social History Social History  Substance Use Topics  . Smoking status: Never Smoker  . Smokeless tobacco: Never Used  . Alcohol use 1.8 oz/week    3 Glasses of wine per week     Allergies   Amoxicillin; Penicillins; and Septra [sulfamethoxazole-trimethoprim]   Review of Systems Review of Systems No sore throat No cough No pleuritic pain No wheezing + nasal congestion No post-nasal drainage No sinus pain/pressure No itchy/red eyes ? earache No hemoptysis No SOB No fever/chills No nausea No vomiting No abdominal pain No diarrhea No urinary symptoms No skin rash No fatigue No myalgias No headache Used OTC meds without relief   Physical Exam Triage  Vital Signs ED Triage Vitals  Enc Vitals Group     BP 05/19/17 1617 116/84     Pulse Rate 05/19/17 1617 (!) 102     Resp 05/19/17 1617 16     Temp 05/19/17 1617 98.4 F (36.9 C)     Temp Source 05/19/17 1617 Oral     SpO2 05/19/17 1617 99 %     Weight 05/19/17 1618 201 lb (91.2 kg)     Height --      Head Circumference --      Peak Flow --      Pain Score 05/19/17 1618 4     Pain Loc --      Pain Edu? --      Excl. in GC? --    No data found.   Updated Vital Signs BP 116/84 (BP Location: Left Arm)   Pulse (!) 102   Temp 98.4 F (36.9 C) (Oral)   Resp 16   Wt  201 lb (91.2 kg)   SpO2 99%   BMI 34.50 kg/m   Visual Acuity Right Eye Distance:   Left Eye Distance:   Bilateral Distance:    Right Eye Near:   Left Eye Near:    Bilateral Near:     Physical Exam Nursing notes and Vital Signs reviewed. Appearance:  Patient appears stated age, and in no acute distress Eyes:  Pupils are equal, round, and reactive to light and accomodation.  Extraocular movement is intact.  Conjunctivae are not inflamed  Ears:  Canals normal.  Tympanic membranes normal. There is distinct mild tenderness over the left temporomandibular joint.  Palpation there recreates her pain.   Nose:  Mildly congested turbinates.  No sinus tenderness.  Pharynx:  Normal Neck:  Supple.  Mild tenderness posterior/lateral nodes on the left.  Lungs:  Normal respiratory rate. Heart:  Normal respiratory rate.    Skin:  No rash present.    UC Treatments / Results  Labs (all labs ordered are listed, but only abnormal results are displayed) Labs Reviewed -  Tympanometry:  Right ear tympanogram normal; Left ear tympanogram normal  EKG  EKG Interpretation None       Radiology No results found.  Procedures Procedures (including critical care time)  Medications Ordered in UC Medications - No data to display   Initial Impression / Assessment and Plan / UC Course  I have reviewed the triage vital signs and the nursing notes.  Pertinent labs & imaging results that were available during my care of the patient were reviewed by me and considered in my medical decision making (see chart for details).    No evidence otitis media or externa.  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 Begin prednisone burst/taper. May add Pseudoephedrine ( , one or two every 4 to 6 hours) for sinus congestion.    Recommend follow-up with ENT if not improved about 10 days.    Final Clinical Impressions(s) / UC Diagnoses   Final diagnoses:  Otalgia of left ear    New Prescriptions New Prescriptions    PREDNISONE (DELTASONE) 20 MG TABLET    Take one tab by mouth twice daily for 4 days, then one daily for 3 days. Take with food.         Lattie Haw, MD 05/19/17 [phone]

## 2017-05-28 ENCOUNTER — Other Ambulatory Visit: Payer: Self-pay | Admitting: Sports Medicine

## 2017-05-28 DIAGNOSIS — E039 Hypothyroidism, unspecified: Secondary | ICD-10-CM

## 2017-06-02 ENCOUNTER — Other Ambulatory Visit: Payer: Self-pay | Admitting: Sports Medicine

## 2017-06-02 DIAGNOSIS — E039 Hypothyroidism, unspecified: Secondary | ICD-10-CM

## 2017-07-12 ENCOUNTER — Other Ambulatory Visit: Payer: Self-pay | Admitting: Physician Assistant

## 2017-07-12 DIAGNOSIS — F331 Major depressive disorder, recurrent, moderate: Secondary | ICD-10-CM

## 2017-07-12 DIAGNOSIS — F411 Generalized anxiety disorder: Secondary | ICD-10-CM

## 2017-07-12 DIAGNOSIS — E039 Hypothyroidism, unspecified: Secondary | ICD-10-CM

## 2017-08-03 ENCOUNTER — Other Ambulatory Visit: Payer: Self-pay | Admitting: Physician Assistant

## 2017-08-03 DIAGNOSIS — F411 Generalized anxiety disorder: Secondary | ICD-10-CM

## 2017-08-03 DIAGNOSIS — F331 Major depressive disorder, recurrent, moderate: Secondary | ICD-10-CM

## 2017-09-08 ENCOUNTER — Other Ambulatory Visit: Payer: Self-pay | Admitting: Physician Assistant

## 2017-09-08 DIAGNOSIS — F411 Generalized anxiety disorder: Secondary | ICD-10-CM

## 2017-09-08 DIAGNOSIS — F331 Major depressive disorder, recurrent, moderate: Secondary | ICD-10-CM

## 2017-11-25 ENCOUNTER — Ambulatory Visit (INDEPENDENT_AMBULATORY_CARE_PROVIDER_SITE_OTHER): Payer: BC Managed Care – PPO | Admitting: Physician Assistant

## 2017-11-25 ENCOUNTER — Encounter: Payer: Self-pay | Admitting: Physician Assistant

## 2017-11-25 VITALS — BP 121/74 | HR 91 | Ht 64.0 in | Wt 201.0 lb

## 2017-11-25 DIAGNOSIS — E039 Hypothyroidism, unspecified: Secondary | ICD-10-CM

## 2017-11-25 DIAGNOSIS — F331 Major depressive disorder, recurrent, moderate: Secondary | ICD-10-CM

## 2017-11-25 DIAGNOSIS — E559 Vitamin D deficiency, unspecified: Secondary | ICD-10-CM | POA: Diagnosis not present

## 2017-11-25 DIAGNOSIS — F411 Generalized anxiety disorder: Secondary | ICD-10-CM | POA: Diagnosis not present

## 2017-11-25 DIAGNOSIS — Z79899 Other long term (current) drug therapy: Secondary | ICD-10-CM | POA: Diagnosis not present

## 2017-11-25 DIAGNOSIS — F900 Attention-deficit hyperactivity disorder, predominantly inattentive type: Secondary | ICD-10-CM

## 2017-11-25 MED ORDER — SERTRALINE HCL 100 MG PO TABS: 200.0000 mg | ORAL_TABLET | Freq: Every day | ORAL | 5 refills | Status: DC

## 2017-11-25 MED ORDER — BUSPIRONE HCL 5 MG PO TABS: 5.0000 mg | ORAL_TABLET | Freq: Two times a day (BID) | ORAL | 5 refills | Status: DC

## 2017-11-25 MED ORDER — AMPHETAMINE-DEXTROAMPHET ER 30 MG PO CP24: 30.0000 mg | ORAL_CAPSULE | ORAL | 0 refills | Status: DC

## 2017-11-25 NOTE — Progress Notes (Signed)
Subjective:    Patient ID: Lydia Gregory, female    DOB: 08/07/1991, 27 y.o.   MRN: [id number]  HPI Patient is a 27 yo obese female who presents for a medication refill. She states she is in need of all her refills. Her only complaint at this time is that she wishes she could go up on the adderall as she feels a little more anxious/having trouble concentrating throughout the day. He denies any insomnia, increase in anxiety, palpitations. sShe states that she is still feeling some what more depressed still but it is not anything that has brought much concern to her. Patient states that she may be willing to try switching to a different SSRI/SNRI in May/June after school ends and she will not have the stress of that.  .. Active Ambulatory Problems    Diagnosis Date Noted  . Class 1 obesity due to excess calories without serious comorbidity with body mass index (BMI) of 33.0 to 33.9 in adult 08/20/2016  . Vitamin D deficiency 08/20/2016  . MDD (major depressive disorder), recurrent episode, moderate (HCC) 08/20/2016  . GAD (generalized anxiety disorder) 08/20/2016  . No energy 01/02/2017  . Hypothyroidism 01/02/2017  . Inattention 01/02/2017  . Attention deficit hyperactivity disorder (ADHD), predominantly inattentive type 01/02/2017   Resolved Ambulatory Problems    Diagnosis Date Noted  . No Resolved Ambulatory Problems   Past Medical History:  Diagnosis Date  . Depressed       Review of Systems  Constitutional: Negative for activity change and appetite change.  Neurological: Negative for dizziness and numbness.  Psychiatric/Behavioral: Positive for decreased concentration.       Objective:   Physical Exam  Constitutional: She is oriented to person, place, and time. She appears well-developed and well-nourished.  obese  HENT:  Head: Normocephalic and atraumatic.  Eyes: Conjunctivae and EOM are normal.  Cardiovascular: Normal rate, regular rhythm and normal heart sounds.   Pulmonary/Chest: Breath sounds normal.  Neurological: She is alert and oriented to person, place, and time.  Psychiatric: She has a normal mood and affect. Her behavior is normal.          Assessment & Plan:  Marland Kitchen.Marland Kitchen.Diagnoses and all orders for this visit:  Attention deficit hyperactivity disorder (ADHD), predominantly inattentive type -     amphetamine-dextroamphetamine (ADDERALL XR) 30 MG 24 hr capsule; Take 1 capsule (30 mg total) by mouth every morning.  Vitamin D insufficiency -     Vitamin D 1,25 dihydroxy  Acquired hypothyroidism -     TSH  Medication management -     TSH -     B12 -     Vitamin D 1,25 dihydroxy -     COMPLETE METABOLIC PANEL WITH GFR  MDD (major depressive disorder), recurrent episode, moderate (HCC) -     busPIRone (BUSPAR) 5 MG tablet; Take 1 tablet (5 mg total) by mouth 2 (two) times daily. -     sertraline (ZOLOFT) 100 MG tablet; Take 2 tablets (200 mg total) by mouth daily.  GAD (generalized anxiety disorder) -     busPIRone (BUSPAR) 5 MG tablet; Take 1 tablet (5 mg total) by mouth 2 (two) times daily. -     sertraline (ZOLOFT) 100 MG tablet; Take 2 tablets (200 mg total) by mouth daily.  .. [facility] 11/25/2017 04/21/2017 12/31/2016 08/20/2016  Decreased Interest 0 1 1 2   Down, Depressed, Hopeless 1 1 2 1   PHQ - 2 Score 1 2 3 3   Altered  sleeping 2 0 2 3  Tired, decreased energy 2 0 3 3  Change in appetite 1 0 2 2  Feeling bad or failure about yourself  1 0 1 1  Trouble concentrating 1 0 3 3  Moving slowly or fidgety/restless 0 0 2 0  Suicidal thoughts 0 0 0 0  PHQ-9 Score 8 2 16 15   Difficult doing work/chores Somewhat difficult - - -   .Marland Kitchen GAD 7 : Generalized Anxiety Score 11/25/2017 04/21/2017 08/20/2016  Nervous, Anxious, on Edge 1 0 3  Control/stop worrying 0 0 2  Worry too much - different things 1 0 3  Trouble relaxing 0 0 3  Restless 0 0 3  Easily annoyed or irritable 1 1 3   Afraid - awful might happen 0 0 0   Total GAD 7 Score 3 1 17   Anxiety Difficulty Somewhat difficult Not difficult at all Very difficult    Increased adderall to 30mg . Only gave one month. I would llike for her to give a verbal on how she is doing then.   Will hold on any zoloft changes until a more stressfull time in her life. I would like to consider a switch to viibryd or trintellix.

## 2017-11-29 ENCOUNTER — Encounter: Payer: Self-pay | Admitting: Physician Assistant

## 2017-12-23 ENCOUNTER — Encounter: Payer: Self-pay | Admitting: Physician Assistant

## 2017-12-24 ENCOUNTER — Other Ambulatory Visit: Payer: Self-pay | Admitting: Physician Assistant

## 2017-12-24 ENCOUNTER — Other Ambulatory Visit: Payer: Self-pay

## 2017-12-24 DIAGNOSIS — Z113 Encounter for screening for infections with a predominantly sexual mode of transmission: Secondary | ICD-10-CM

## 2017-12-24 NOTE — Telephone Encounter (Signed)
Call patient and send orders downstairs for UPT/GC/Chlamydia and then scheduling to see Dr. Lyn HollingsheadAlexander or Dr. Denyse Amassorey for nexplanon placement.

## 2017-12-30 LAB — C. TRACHOMATIS/N. GONORRHOEAE RNA
C. TRACHOMATIS RNA, TMA: NOT DETECTED
N. GONORRHOEAE RNA, TMA: NOT DETECTED

## 2017-12-31 ENCOUNTER — Encounter: Payer: Self-pay | Admitting: Physician Assistant

## 2017-12-31 ENCOUNTER — Ambulatory Visit: Payer: BC Managed Care – PPO | Admitting: Osteopathic Medicine

## 2017-12-31 ENCOUNTER — Encounter: Payer: Self-pay | Admitting: Osteopathic Medicine

## 2017-12-31 DIAGNOSIS — R7401 Elevation of levels of liver transaminase levels: Secondary | ICD-10-CM | POA: Insufficient documentation

## 2017-12-31 DIAGNOSIS — Z30017 Encounter for initial prescription of implantable subdermal contraceptive: Secondary | ICD-10-CM

## 2017-12-31 DIAGNOSIS — Z3046 Encounter for surveillance of implantable subdermal contraceptive: Secondary | ICD-10-CM | POA: Diagnosis not present

## 2017-12-31 DIAGNOSIS — R740 Nonspecific elevation of levels of transaminase and lactic acid dehydrogenase [LDH]: Secondary | ICD-10-CM

## 2017-12-31 DIAGNOSIS — E039 Hypothyroidism, unspecified: Secondary | ICD-10-CM

## 2017-12-31 LAB — VITAMIN B12: VITAMIN B 12: 603 pg/mL (ref 200–1100)

## 2017-12-31 LAB — COMPLETE METABOLIC PANEL WITH GFR
AG RATIO: 1.7 (calc) (ref 1.0–2.5)
ALBUMIN MSPROF: 4.7 g/dL (ref 3.6–5.1)
ALT: 73 U/L — AB (ref 6–29)
AST: 42 U/L — ABNORMAL HIGH (ref 10–30)
Alkaline phosphatase (APISO): 106 U/L (ref 33–115)
BUN: 10 mg/dL (ref 7–25)
CO2: 27 mmol/L (ref 20–32)
Calcium: 10 mg/dL (ref 8.6–10.2)
Chloride: 102 mmol/L (ref 98–110)
Creat: 0.71 mg/dL (ref 0.50–1.10)
GFR, EST AFRICAN AMERICAN: 138 mL/min/{1.73_m2} (ref >=60)
GFR, Est Non African American: 119 mL/min/{1.73_m2} (ref >=60)
Globulin: 2.7 g/dL (calc) (ref 1.9–3.7)
Glucose, Bld: 90 mg/dL (ref 65–99)
POTASSIUM: 4.4 mmol/L (ref 3.5–5.3)
Sodium: 137 mmol/L (ref 135–146)
TOTAL PROTEIN: 7.4 g/dL (ref 6.1–8.1)
Total Bilirubin: 1.2 mg/dL (ref 0.2–1.2)

## 2017-12-31 LAB — VITAMIN D 1,25 DIHYDROXY
Vitamin D 1, 25 (OH)2 Total: 52 pg/mL (ref 18–72)
Vitamin D2 1, 25 (OH)2: 13 pg/mL
Vitamin D3 1, 25 (OH)2: 39 pg/mL

## 2017-12-31 LAB — TSH: TSH: 12.1 mIU/L — ABNORMAL HIGH

## 2017-12-31 LAB — POCT URINE PREGNANCY: PREG TEST UR: NEGATIVE

## 2017-12-31 MED ORDER — ETONOGESTREL 68 MG ~~LOC~~ IMPL: DRUG_IMPLANT | SUBCUTANEOUS | 0 refills | Status: AC

## 2017-12-31 MED ORDER — LEVOTHYROXINE SODIUM 150 MCG PO TABS: 150.0000 ug | ORAL_TABLET | Freq: Every day | ORAL | 3 refills | Status: DC

## 2017-12-31 NOTE — Progress Notes (Signed)
Good morning Lydia Gregory,  Your TSH is still high. Just wanted to confirm that you are taking Levothyroxine 175 mcg before breakfast on an empty stomach.  B12 level is normal.  Liver enzymes are elevated.This may be due to the Wellbutrin. Would recommend rechecking this in 3 months and in the meantime limit or abstain from alcohol, avoid saturated fats and heavily processed foods.

## 2017-12-31 NOTE — Patient Instructions (Signed)
Etonogestrel implant What is this medicine? ETONOGESTREL (et oh noe JES trel) is a contraceptive (birth control) device. It is used to prevent pregnancy. It can be used for up to 3 years. This medicine may be used for other purposes; ask your health care provider or pharmacist if you have questions. COMMON BRAND NAME(S): Implanon, Nexplanon What should I tell my health care provider before I take this medicine? They need to know if you have any of these conditions: -abnormal vaginal bleeding -blood vessel disease or blood clots -cancer of the breast, cervix, or liver -depression -diabetes -gallbladder disease -headaches -heart disease or recent heart attack -high blood pressure -high cholesterol -kidney disease -liver disease -renal disease -seizures -tobacco smoker -an unusual or allergic reaction to etonogestrel, other hormones, anesthetics or antiseptics, medicines, foods, dyes, or preservatives -pregnant or trying to get pregnant -breast-feeding How should I use this medicine? This device is inserted just under the skin on the inner side of your upper arm by a health care professional. Talk to your pediatrician regarding the use of this medicine in children. Special care may be needed. Overdosage: If you think you have taken too much of this medicine contact a poison control center or emergency room at once. NOTE: This medicine is only for you. Do not share this medicine with others. What if I miss a dose? This does not apply. What may interact with this medicine? Do not take this medicine with any of the following medications: -amprenavir -bosentan -fosamprenavir This medicine may also interact with the following medications: -barbiturate medicines for inducing sleep or treating seizures -certain medicines for fungal infections like ketoconazole and itraconazole -grapefruit juice -griseofulvin -medicines to treat seizures like carbamazepine, felbamate, oxcarbazepine,  phenytoin, topiramate -modafinil -phenylbutazone -rifampin -rufinamide -some medicines to treat HIV infection like atazanavir, indinavir, lopinavir, nelfinavir, tipranavir, ritonavir -St. John's wort This list may not describe all possible interactions. Give your health care provider a list of all the medicines, herbs, non-prescription drugs, or dietary supplements you use. Also tell them if you smoke, drink alcohol, or use illegal drugs. Some items may interact with your medicine. What should I watch for while using this medicine? This product does not protect you against HIV infection (AIDS) or other sexually transmitted diseases. You should be able to feel the implant by pressing your fingertips over the skin where it was inserted. Contact your doctor if you cannot feel the implant, and use a non-hormonal birth control method (such as condoms) until your doctor confirms that the implant is in place. If you feel that the implant may have broken or become bent while in your arm, contact your healthcare provider. What side effects may I notice from receiving this medicine? Side effects that you should report to your doctor or health care professional as soon as possible: -allergic reactions like skin rash, itching or hives, swelling of the face, lips, or tongue -breast lumps -changes in emotions or moods -depressed mood -heavy or prolonged menstrual bleeding -pain, irritation, swelling, or bruising at the insertion site -scar at site of insertion -signs of infection at the insertion site such as fever, and skin redness, pain or discharge -signs of pregnancy -signs and symptoms of a blood clot such as breathing problems; changes in vision; chest pain; severe, sudden headache; pain, swelling, warmth in the leg; trouble speaking; sudden numbness or weakness of the face, arm or leg -signs and symptoms of liver injury like dark yellow or brown urine; general ill feeling or flu-like symptoms;  light-colored   stools; loss of appetite; nausea; right upper belly pain; unusually weak or tired; yellowing of the eyes or skin -unusual vaginal bleeding, discharge -signs and symptoms of a stroke like changes in vision; confusion; trouble speaking or understanding; severe headaches; sudden numbness or weakness of the face, arm or leg; trouble walking; dizziness; loss of balance or coordination Side effects that usually do not require medical attention (report to your doctor or health care professional if they continue or are bothersome): -acne -back pain -breast pain -changes in weight -dizziness -general ill feeling or flu-like symptoms -headache -irregular menstrual bleeding -nausea -sore throat -vaginal irritation or inflammation This list may not describe all possible side effects. Call your doctor for medical advice about side effects. You may report side effects to FDA at 1-800-FDA-1088. Where should I keep my medicine? This drug is given in a hospital or clinic and will not be stored at home. NOTE: This sheet is a summary. It may not cover all possible information. If you have questions about this medicine, talk to your doctor, pharmacist, or health care provider.  2018 Elsevier/Gold Standard (2016-03-14 11:19:22)  

## 2017-12-31 NOTE — Progress Notes (Signed)
HPI: Lydia AblesLogan Gregory is a 27 y.o. female who  has a past medical history of Depressed.  [facility],  for chief complaint of:  Nexplanon removal and reinsertion   Happy with Nexplanon and would like to continue this device.   Past medical history, surgical history, social history and family history reviewed.  Current medication list and allergy/intolerance information reviewed.    Current Outpatient Medications on File Prior to Visit  Medication Sig Dispense Refill  . ALPRAZolam (XANAX) 0.5 MG tablet Take 1 tablet (0.5 mg total) by mouth 2 (two) times daily as needed for anxiety. 30 tablet 2  . amphetamine-dextroamphetamine (ADDERALL XR) 30 MG 24 hr capsule Take 1 capsule (30 mg total) by mouth every morning. 30 capsule 0  . busPIRone (BUSPAR) 5 MG tablet Take 1 tablet (5 mg total) by mouth 2 (two) times daily. 60 tablet 5  . fexofenadine (ALLEGRA) 180 MG tablet Take 180 mg by mouth daily.    . sertraline (ZOLOFT) 100 MG tablet Take 2 tablets (200 mg total) by mouth daily. 60 tablet 5   No current facility-administered medications on file prior to visit.    Allergies  Allergen Reactions  . Amoxicillin   . Penicillins   . Septra [Sulfamethoxazole-Trimethoprim]       Review of Systems:  Constitutional: No recent illness  Neurologic: No  weakness, No  Dizziness   Exam:  BP 133/88   Pulse (!) 103   Temp 98.2 F (36.8 C) (Oral)   Ht 5\' 4"  (1.626 m)   Wt 195 lb 9.6 oz (88.7 kg)   BMI 33.57 kg/m   Constitutional: VS see above. General Appearance: alert, well-developed, well-nourished, NAD  Skin: warm, dry, intact.   Psychiatric: Normal judgment/insight. Normal mood and affect. Oriented x3.   Results for orders placed or performed in visit on 12/31/17 (from the past 24 hour(s))  POCT urine pregnancy     Status: None   Collection Time: 12/31/17  4:06 PM  Result Value Ref Range   Preg Test, Ur  Negative Negative     Risks and benefits reviewed with the patient. Informed consent obtained, patient opts to proceed today with removal and reinsertion of Nexplanon device.   NEXPLANON REMOVAL PRE-OP DIAGNOSIS: Nexplanon, desire for removal old device and reinsertion new device POST-OP DIAGNOSIS: Same  PROCEDURE: Nexplanon Removal  Anesthesia: 2% Xylocaine w/ epinephrine 5 ml  Procedure: Consent obtained. A time-out was performed prior to initiating procedure to be sure of right patient and right location. The area surrounding the Nexplanon was prepared in the usual sterile manner. The site was anesthetized with lidocaine. A skin incision was made over the distal aspect of the device. The capsule lysed sharply and the device removed using a hemostat. See below Followup: The patient tolerated the procedure well without complications. Standard post-procedure care is explained and return precautions are given.   NEXPLANON INSERTION PRE-OP DIAGNOSIS: desired long-term, reversible contraception  POST-OP DIAGNOSIS: Same  PROCEDURE: Nexplanon  placement Site (check): left arm  Lot # [id number] Sterile Preparation: Chlorhexidine   Insertion site was selected 8 - 10 cm from medial epicondyle and marked along with guiding site using sterile marker in site of removal of the previous device. Anesthesia confirmed.  Nexplanon  trocar was inserted subcutaneously and then Nexplanon  capsule delivered subcutaneously Trocar was removed from the insertion site. Nexplanon  capsule was palpated by provider and patient to assure satisfactory placement. Estimated blood loss <  1 mL Dressings applied: Steri-Strip and small pressure bandage Followup: The patient tolerated the procedure well without complications.  Standard post-procedure care is explained and return precautions are given.     ASSESSMENT/PLAN: Diagnoses of Nexplanon removal and Insertion of Nexplanon were pertinent to this visit.  Meds  ordered this encounter  Medications  . etonogestrel (NEXPLANON) 68 MG IMPL implant    Sig: Inserted 12/31/17 Lot# [id number]    Dispense:  1 each    Refill:  0      Follow-up plan: Return for recheck as needed.  Visit summary with medication list and pertinent instructions was printed for patient to review, alert [place] if any changes needed. All questions at time of visit were answered - patient instructed to contact office with any additional concerns. ER/RTC precautions were reviewed with the patient and understanding verbalized.    Please note: voice recognition software was used to produce this document, and typos may escape review. Please contact Dr. Lyn Hollingshead for any needed clarifications.

## 2018-01-21 ENCOUNTER — Ambulatory Visit (INDEPENDENT_AMBULATORY_CARE_PROVIDER_SITE_OTHER): Payer: BC Managed Care – PPO | Admitting: Physician Assistant

## 2018-01-21 ENCOUNTER — Encounter: Payer: Self-pay | Admitting: Physician Assistant

## 2018-01-21 VITALS — BP 117/71 | HR 81 | Ht 64.0 in | Wt 194.0 lb

## 2018-01-21 DIAGNOSIS — F331 Major depressive disorder, recurrent, moderate: Secondary | ICD-10-CM | POA: Diagnosis not present

## 2018-01-21 DIAGNOSIS — F900 Attention-deficit hyperactivity disorder, predominantly inattentive type: Secondary | ICD-10-CM

## 2018-01-21 DIAGNOSIS — F411 Generalized anxiety disorder: Secondary | ICD-10-CM | POA: Diagnosis not present

## 2018-01-21 MED ORDER — AMPHETAMINE-DEXTROAMPHET ER 30 MG PO CP24: 30.0000 mg | ORAL_CAPSULE | ORAL | 0 refills | Status: DC

## 2018-01-21 MED ORDER — VILAZODONE HCL 20 MG PO TABS: 1.0000 | ORAL_TABLET | Freq: Every day | ORAL | 1 refills | Status: DC

## 2018-01-21 MED ORDER — AMPHETAMINE-DEXTROAMPHET ER 30 MG PO CP24
30.0000 mg | ORAL_CAPSULE | ORAL | 0 refills | Status: DC
Start: 2018-01-21 — End: 2018-02-25

## 2018-01-21 NOTE — Progress Notes (Signed)
Subjective:    Patient ID: Lydia Gregory, female    DOB: [birth year], 27 y.o.   MRN: [id number]  HPI  Patient is a pleasant 27 yo female who presents today for follow-up of anxiety and ADHD.   Anxiety- previously discussed switching her Zoloft to another medication due to the fact that she has been on this medication for many years and she feels like it might be wearing off at this time due to her recent increased feelings of anxiety and depression. She would like to go ahead and switch that medication today. She feels like her anxiety is fairly well controlled with the Buspar, however her depression has worsened. She denies SI/HI. She did express interest in counseling.  ADHD- she reports being well controlled on her current dose of Adderall. She states that her co-workers can often tell if she has not taken her medication, and that she is a lot more productive when she takes her medication. She denies palpitations, sleep disturbances.  .. Active Ambulatory Problems    Diagnosis Date Noted  . Class 1 obesity due to excess calories without serious comorbidity with body mass index (BMI) of 33.0 to 33.9 in adult 08/20/2016  . Vitamin D deficiency 08/20/2016  . MDD (major depressive disorder), recurrent episode, moderate (HCC) 08/20/2016  . GAD (generalized anxiety disorder) 08/20/2016  . No energy 01/02/2017  . Hypothyroidism 01/02/2017  . Inattention 01/02/2017  . Attention deficit hyperactivity disorder (ADHD), predominantly inattentive type 01/02/2017  . Transaminitis 12/31/2017   Resolved Ambulatory Problems    Diagnosis Date Noted  . No Resolved Ambulatory Problems   Past Medical History:  Diagnosis Date  . Depressed      Review of Systems  All other systems reviewed and are negative.      Objective:   Physical Exam  Constitutional: She is oriented to person, place, and time. She appears well-developed and well-nourished.  HENT:  Head: Normocephalic and atraumatic.   Eyes: Conjunctivae and EOM are normal.  Cardiovascular: Normal rate, regular rhythm and normal heart sounds.  Pulmonary/Chest: Effort normal and breath sounds normal.  Neurological: She is alert and oriented to person, place, and time.  Skin: Skin is warm and dry.  Psychiatric: She has a normal mood and affect. Her behavior is normal. Judgment and thought content normal.      Assessment & Plan:  Marland KitchenMarland KitchenDiagnoses and all orders for this visit:  MDD (major depressive disorder), recurrent episode, moderate (HCC) -     Vilazodone HCl 20 MG TABS; Take 1 tablet (20 mg total) by mouth daily.  Attention deficit hyperactivity disorder (ADHD), predominantly inattentive type -     amphetamine-dextroamphetamine (ADDERALL XR) 30 MG 24 hr capsule; Take 1 capsule (30 mg total) by mouth every morning. -     amphetamine-dextroamphetamine (ADDERALL XR) 30 MG 24 hr capsule; Take 1 capsule (30 mg total) by mouth every morning. -     amphetamine-dextroamphetamine (ADDERALL XR) 30 MG 24 hr capsule; Take 1 capsule (30 mg total) by mouth every morning.  GAD (generalized anxiety disorder) -     Vilazodone HCl 20 MG TABS; Take 1 tablet (20 mg total) by mouth daily.  .. Depression screen [facility] 2/9 01/21/2018 11/25/2017 04/21/2017 12/31/2016 08/20/2016  Decreased Interest 1 0 Down, Depressed, Hopeless PHQ - 2 Score Altered sleeping 1 2 0 2 3  Tired, decreased energy 2 2 0 3 3  Change in  appetite 0 1 0 2 2  Feeling bad or failure about yourself  1 1 0 1 1  Trouble concentrating 2 1 0 3 3  Moving slowly or fidgety/restless 0 0 0 2 0  Suicidal thoughts 1 0 0 0 0  PHQ-9 Score Difficult doing work/chores Very difficult Somewhat difficult - - -   .Marland Kitchen GAD 7 : Generalized Anxiety Score 01/21/2018 11/25/2017 04/21/2017 08/20/2016  Nervous, Anxious, on Edge 1 1 0 3  Control/stop worrying 0 0 0 2  Worry too much - different things 1 1 0 3  Trouble relaxing 2 0 0 3  Restless 1 0 0 3   Easily annoyed or irritable Afraid - awful might happen 0 0 0 0  Total GAD 7 Score Anxiety Difficulty Somewhat difficult Somewhat difficult Not difficult at all Very difficult    Will taper patient off of Zoloft and start Vibryd. Pt has tried and failed zoloft, prozac, lexapro. Discussed need for counseling. Gave pt card for some good resources. Follow up in 4-6 weeks. Coupon card given.   Continue current dose of Adderall. Refilled for 3 months.

## 2018-01-21 NOTE — Patient Instructions (Signed)
Vilazodone oral tablet What is this medicine? VILAZODONE (vil [place] oh done) is used to treat depression. This medicine may be used for other purposes; ask your health care provider or pharmacist if you have questions. COMMON BRAND NAME(S): [id number] What should I tell my health care provider before I take this medicine? They need to know if you have any of these conditions: -bipolar disorder or a family history of bipolar disorder -glaucoma -liver disease -low levels of sodium in the blood -receiving electroconvulsive therapy -seizures (convulsions) -suicidal thoughts, plans, or attempt by you or a family member -an unusual or allergic reaction to vilazodone, other medicines, foods, dyes or preservatives -pregnant or trying to get pregnant -breast-feeding How should I use this medicine? Take this medicine by mouth with a glass of water. Follow the directions on the prescription label. Take this medicine with food. Take your medicine at regular intervals. Do not take your medicine more often than directed. Do not stop taking this medicine suddenly except upon the advice of your doctor. Stopping this medicine too quickly may cause serious side effects or your condition may worsen. A special MedGuide will be given to you by the pharmacist with each prescription and refill. Be sure to read this information carefully each time. Overdosage: If you think you have taken too much of this medicine contact a poison control center or emergency room at once. NOTE: This medicine is only for you. Do not share this medicine with others. What if I miss a dose? If you miss a dose, take it as soon as you can. If it is almost time for your next dose, take only that dose. Do not take double or extra doses. What may interact with this medicine? Do not take this medicine with any of the following medications: -linezolid -MAOIs like Carbex, Eldepryl, Marplan, Nardil, and Parnate -methylene blue (injected into a  vein) This medicine may also interact with the following medications: -amphetamines -aspirin and aspirin-like medicines -buspirone -certain diet drugs like dexfenfluramine, fenfluramine, phentermine, sibutramine -certain migraine headache medicines like almotriptan, eletriptan, frovatriptan, naratriptan, rizatriptan, sumatriptan, zolmitriptan -certain medicines that treat or prevent blood clots like warfarin, enoxaparin, and dalteparin -certain medicines that treat infections like clarithromycin, itraconazole, voriconazole, ketoconazole, rifampin -certain medicines that treat seizures like carbamazepine and phenytoin -digoxin -fentanyl -lithium -NSAIDS, medicines for pain and inflammation, like ibuprofen or naproxen -other medicines for depression, anxiety, or psychotic disturbances -St. John's Wort -tramadol -tryptophan This list may not describe all possible interactions. Give your health care provider a list of all the medicines, herbs, non-prescription drugs, or dietary supplements you use. Also tell them if you smoke, drink alcohol, or use illegal drugs. Some items may interact with your medicine. What should I watch for while using this medicine? Tell your doctor if your symptoms do not get better or if they get worse. Visit your doctor or health care professional for regular checks on your progress. Because it may take several weeks to see the full effects of this medicine, it is important to continue your treatment as prescribed by your doctor. Patients and their families should watch out for new or worsening thoughts of suicide or depression. Also watch out for sudden changes in feelings such as feeling anxious, agitated, panicky, irritable, hostile, aggressive, impulsive, severely restless, overly excited and hyperactive, or not being able to sleep. If this happens, especially at the beginning of treatment or after a change in dose, call your health care professional. You may get  drowsy or dizzy. Do   not drive, use machinery, or do anything that needs mental alertness until you know how this medicine affects you. Do not stand or sit up quickly, especially if you are an older patient. This reduces the risk of dizzy or fainting spells. Alcohol may interfere with the effect of this medicine. Avoid alcoholic drinks. Your mouth may get dry. Chewing sugarless gum or sucking hard candy, and drinking plenty of water may help. Contact your doctor if the problem does not go away or is severe. What side effects may I notice from receiving this medicine? Side effects that you should report to your doctor or health care professional as soon as possible: -allergic reactions like skin rash, itching or hives, swelling of the face, lips, or tongue -anxious -black, tarry stools -changes in vision -confusion -elevated mood, decreased need for sleep, racing thoughts, impulsive behavior -eye pain -fast, irregular heartbeat -feeling faint or lightheaded, falls -feeling agitated, angry, or irritable -hallucination, loss of contact with reality -loss of balance or coordination -loss of memory -restlessness, pacing, inability to keep still -seizures -stiff muscles -suicidal thoughts or other mood changes -trouble sleeping -unusual bleeding or bruising -unusually weak or tired -vomiting Side effects that usually do not require medical attention (report to your doctor or health care professional if they continue or are bothersome): -change in appetite or weight -change in sex drive or performance -diarrhea -drowsiness -dry mouth -increased sweating -nausea -tremors This list may not describe all possible side effects. Call your doctor for medical advice about side effects. You may report side effects to FDA at 1-800-FDA-1088. Where should I keep my medicine? Keep out of the reach of children. Store at room temperature between 15 and 30 degrees C (59 to 86 degrees F). Throw away any  unused medicine after the expiration date. NOTE: This sheet is a summary. It may not cover all possible information. If you have questions about this medicine, talk to your doctor, pharmacist, or health care provider.  2018 Elsevier/Gold Standard (2016-04-23 12:50:48)  

## 2018-02-25 ENCOUNTER — Ambulatory Visit (INDEPENDENT_AMBULATORY_CARE_PROVIDER_SITE_OTHER): Payer: BC Managed Care – PPO | Admitting: Physician Assistant

## 2018-02-25 ENCOUNTER — Encounter: Payer: Self-pay | Admitting: Physician Assistant

## 2018-02-25 DIAGNOSIS — F331 Major depressive disorder, recurrent, moderate: Secondary | ICD-10-CM

## 2018-02-25 DIAGNOSIS — F411 Generalized anxiety disorder: Secondary | ICD-10-CM

## 2018-02-25 MED ORDER — VORTIOXETINE HBR 10 MG PO TABS: 10.0000 mg | ORAL_TABLET | Freq: Every day | ORAL | 1 refills | Status: DC

## 2018-02-25 MED ORDER — BUSPIRONE HCL 5 MG PO TABS: 5.0000 mg | ORAL_TABLET | Freq: Two times a day (BID) | ORAL | 5 refills | Status: DC

## 2018-02-25 NOTE — Progress Notes (Signed)
Subjective:    Patient ID: Lydia Gregory, female    DOB: April 16, 1991, 27 y.o.   MRN: [id number]  HPI Patient is a 27 yo female who presents to clinic today for follow up on depression and anxiety.  At her last visit one month ago she was switched from Zoloft, which she had been on for many years, due to decreasing response of her anxiety and depression to the medication. She was switched to Viibryd at that time. She feels likes her depression has been worsening since. Her anxiety has worsened some as well but overall she is happy with the Buspar and has not needed Xanax more frequently, she is more concerned about her depression. She has decreased interest in activities and denies new life stressors or difficulty sleeping. She denies SI/HI.   Marland Kitchen. Active Ambulatory Problems    Diagnosis Date Noted  . Class 1 obesity due to excess calories without serious comorbidity with body mass index (BMI) of 33.0 to 33.9 in adult 08/20/2016  . Vitamin D deficiency 08/20/2016  . MDD (major depressive disorder), recurrent episode, moderate (HCC) 08/20/2016  . GAD (generalized anxiety disorder) 08/20/2016  . No energy 01/02/2017  . Hypothyroidism 01/02/2017  . Inattention 01/02/2017  . Attention deficit hyperactivity disorder (ADHD), predominantly inattentive type 01/02/2017  . Transaminitis 12/31/2017   Resolved Ambulatory Problems    Diagnosis Date Noted  . No Resolved Ambulatory Problems   Past Medical History:  Diagnosis Date  . Depressed       Review of Systems  All other systems reviewed and are negative.      Objective:   Physical Exam  Constitutional: She is oriented to person, place, and time. She appears well-developed and well-nourished. No distress.  HENT:  Head: Normocephalic and atraumatic.  Cardiovascular: Normal rate, regular rhythm and normal heart sounds.  Neurological: She is alert and oriented to person, place, and time.  Skin: Skin is warm and dry. She is not  diaphoretic.  Psychiatric: Her speech is normal and behavior is normal. Judgment and thought content normal. Cognition and memory are normal. She exhibits a depressed mood.  Vitals reviewed.   .. Vitals:   02/25/18 0927  BP: 109/83  Pulse: 96  SpO2: 99%       Assessment & Plan:  Marland KitchenMarland KitchenWinona was seen today for depression.  Diagnoses and all orders for this visit:  MDD (major depressive disorder), recurrent episode, moderate (HCC) -     vortioxetine HBr (TRINTELLIX) 10 MG TABS tablet; Take 1 tablet (10 mg total) by mouth daily. -     busPIRone (BUSPAR) 5 MG tablet; Take 1 tablet (5 mg total) by mouth 2 (two) times daily. -     Ambulatory referral to Psychiatry  GAD (generalized anxiety disorder) -     vortioxetine HBr (TRINTELLIX) 10 MG TABS tablet; Take 1 tablet (10 mg total) by mouth daily. -     busPIRone (BUSPAR) 5 MG tablet; Take 1 tablet (5 mg total) by mouth 2 (two) times daily. -     Ambulatory referral to Psychiatry   .Marland Kitchen Depression screen [facility] 2/9 02/25/2018 01/21/2018 11/25/2017 04/21/2017 12/31/2016  Decreased Interest 2 1 0 1 1  Down, Depressed, Hopeless 2 2 1 1 2   PHQ - 2 Score 4 3 1 2 3   Altered sleeping 2 1 2  0 2  Tired, decreased energy 3 2 2  0 3  Change in appetite 2 0 1 0 2  Feeling bad or failure about yourself  2 1  1 0 1  Trouble concentrating 2 2 1  0 3  Moving slowly or fidgety/restless 1 0 0 0 2  Suicidal thoughts 1 1 0 0 0  PHQ-9 Score 17 10 8 2 16   Difficult doing work/chores Very difficult Very difficult Somewhat difficult - -   .Marland Kitchen. GAD 7 : Generalized Anxiety Score 02/25/2018 01/21/2018 11/25/2017 04/21/2017  Nervous, Anxious, on Edge 2 1 1  0  Control/stop worrying 1 0 0 0  Worry too much - different things 2 1 1  0  Trouble relaxing 2 2 0 0  Restless 1 1 0 0  Easily annoyed or irritable 3 2 1 1   Afraid - awful might happen 0 0 0 0  Total GAD 7 Score 11 7 3 1   Anxiety Difficulty Somewhat difficult Somewhat difficult Somewhat difficult Not difficult at  all   Depression and anxiety - Will continue on Buspar as she is happy with her response. GAD shows increased anxiety but this may be related to worsening depression. Discussed the potential of nexplanon contributing to depression but that this is less likely as she states her depression symptoms presented before her first nexplanon. Also discussed how adderall could be affecting her mood but she is happy with her response to the medication and that she thinks it helps with her anxiety. As depression has worsened on Viibryd, will taper and switch to Trintellix. Will make referral to psychiatry as she has been on several past medications. She was told to call or use Mychart to communicate if she has worsening symptoms or strange thoughts. Another consideration would be to add [facility]al [facility]. Discussed exercise, meditation and other mechanisms to improve depression.   Marland Kitchen..Spent 30 minutes with patient and greater than 50 percent of visit spent counseling patient regarding treatment plan.

## 2018-03-16 ENCOUNTER — Other Ambulatory Visit: Payer: Self-pay | Admitting: Physician Assistant

## 2018-03-16 DIAGNOSIS — F411 Generalized anxiety disorder: Secondary | ICD-10-CM

## 2018-03-16 DIAGNOSIS — F331 Major depressive disorder, recurrent, moderate: Secondary | ICD-10-CM

## 2018-03-25 ENCOUNTER — Encounter: Payer: Self-pay | Admitting: Physician Assistant

## 2018-04-12 ENCOUNTER — Other Ambulatory Visit: Payer: Self-pay | Admitting: Physician Assistant

## 2018-04-12 DIAGNOSIS — F411 Generalized anxiety disorder: Secondary | ICD-10-CM

## 2018-04-14 NOTE — Telephone Encounter (Signed)
PMP Reviewed, last filled 07/2017.  I am okay to send refill

## 2018-04-27 ENCOUNTER — Other Ambulatory Visit: Payer: Self-pay | Admitting: Physician Assistant

## 2018-04-27 DIAGNOSIS — F411 Generalized anxiety disorder: Secondary | ICD-10-CM

## 2018-04-27 DIAGNOSIS — F331 Major depressive disorder, recurrent, moderate: Secondary | ICD-10-CM

## 2018-06-03 ENCOUNTER — Other Ambulatory Visit: Payer: Self-pay | Admitting: Physician Assistant

## 2018-06-03 DIAGNOSIS — F900 Attention-deficit hyperactivity disorder, predominantly inattentive type: Secondary | ICD-10-CM

## 2018-06-04 MED ORDER — AMPHETAMINE-DEXTROAMPHET ER 30 MG PO CP24: 30.0000 mg | ORAL_CAPSULE | ORAL | 0 refills | Status: DC

## 2018-06-05 NOTE — Telephone Encounter (Signed)
Received fax from [facility] that Adderall was approved from 06/05/2018 through 06/05/2021. Form sent to scan.

## 2018-06-26 ENCOUNTER — Other Ambulatory Visit: Payer: Self-pay | Admitting: Physician Assistant

## 2018-06-26 DIAGNOSIS — F411 Generalized anxiety disorder: Secondary | ICD-10-CM

## 2018-06-26 DIAGNOSIS — F331 Major depressive disorder, recurrent, moderate: Secondary | ICD-10-CM

## 2018-08-01 ENCOUNTER — Other Ambulatory Visit: Payer: Self-pay | Admitting: Physician Assistant

## 2018-08-01 DIAGNOSIS — F900 Attention-deficit hyperactivity disorder, predominantly inattentive type: Secondary | ICD-10-CM

## 2018-08-05 MED ORDER — AMPHETAMINE-DEXTROAMPHET ER 30 MG PO CP24: 30.0000 mg | ORAL_CAPSULE | ORAL | 0 refills | Status: DC

## 2018-08-05 NOTE — Telephone Encounter (Signed)
Left pt VM advising RX sent in but she MUST have office visit before any further RX can be sent in. Call back info left for pt to schedule appt

## 2018-08-05 NOTE — Telephone Encounter (Signed)
Last one please call to schedule appt for next month.

## 2018-08-05 NOTE — Telephone Encounter (Signed)
Last OV 02-25-18, pt has been advised OV needed but no future appts are scheduled   RX pended, please advise

## 2018-08-12 ENCOUNTER — Ambulatory Visit: Payer: BC Managed Care – PPO | Admitting: Physician Assistant

## 2018-09-15 ENCOUNTER — Ambulatory Visit: Payer: BC Managed Care – PPO | Admitting: Physician Assistant

## 2018-09-18 ENCOUNTER — Ambulatory Visit (INDEPENDENT_AMBULATORY_CARE_PROVIDER_SITE_OTHER): Payer: BC Managed Care – PPO | Admitting: Physician Assistant

## 2018-09-18 ENCOUNTER — Encounter: Payer: Self-pay | Admitting: Physician Assistant

## 2018-09-18 VITALS — BP 128/82 | HR 100 | Ht 64.0 in | Wt 191.0 lb

## 2018-09-18 DIAGNOSIS — E039 Hypothyroidism, unspecified: Secondary | ICD-10-CM

## 2018-09-18 DIAGNOSIS — F331 Major depressive disorder, recurrent, moderate: Secondary | ICD-10-CM

## 2018-09-18 DIAGNOSIS — F900 Attention-deficit hyperactivity disorder, predominantly inattentive type: Secondary | ICD-10-CM | POA: Diagnosis not present

## 2018-09-18 MED ORDER — AMPHETAMINE-DEXTROAMPHET ER 30 MG PO CP24
30.0000 mg | ORAL_CAPSULE | ORAL | 0 refills | Status: DC
Start: 2018-09-18 — End: 2019-05-25

## 2018-09-18 MED ORDER — LEVOTHYROXINE SODIUM 150 MCG PO TABS: 150.0000 ug | ORAL_TABLET | Freq: Every day | ORAL | 3 refills | Status: DC

## 2018-09-18 MED ORDER — AMPHETAMINE-DEXTROAMPHET ER 30 MG PO CP24: 30.0000 mg | ORAL_CAPSULE | ORAL | 0 refills | Status: DC

## 2018-09-18 NOTE — Progress Notes (Signed)
Subjective:    Patient ID: Lydia Gregory, female    DOB: [birth year], 28 y.o.   MRN: [id number]  HPI Pt is a 28 yo female with ADD, MDD, hypothyroidism who presents to the clinic for follow up and medication refill.   ADD- doing well as a Runner, broadcasting/film/video. No problems or concerns.   Overall mood is ok. Her mother was recently diagnosed with breast cancer and her mood has been up and down with that. No SI/HC. She is walking regularly.   Hypothyroidism- no problems that she knows of. Needs labs. She is compliant with medications.   .. Active Ambulatory Problems    Diagnosis Date Noted  . Class 1 obesity due to excess calories without serious comorbidity with body mass index (BMI) of 33.0 to 33.9 in adult 08/20/2016  . Vitamin D deficiency 08/20/2016  . MDD (major depressive disorder), recurrent episode, moderate (HCC) 08/20/2016  . GAD (generalized anxiety disorder) 08/20/2016  . No energy 01/02/2017  . Hypothyroidism 01/02/2017  . Inattention 01/02/2017  . Attention deficit hyperactivity disorder (ADHD), predominantly inattentive type 01/02/2017  . Transaminitis 12/31/2017   Resolved Ambulatory Problems    Diagnosis Date Noted  . No Resolved Ambulatory Problems   Past Medical History:  Diagnosis Date  . Depressed       Review of Systems  All other systems reviewed and are negative.      Objective:   Physical Exam Vitals signs reviewed.  Constitutional:      Appearance: Normal appearance.  HENT:     Head: Normocephalic and atraumatic.  Cardiovascular:     Rate and Rhythm: Normal rate and regular rhythm.  Neurological:     General: No focal deficit present.     Mental Status: She is alert.  Psychiatric:        Mood and Affect: Mood normal.        Behavior: Behavior normal.           Assessment & Plan:  .Lydia Gregory was seen today for medication refill.  Diagnoses and all orders for this visit:  Acquired hypothyroidism -     TSH  Attention deficit hyperactivity  disorder (ADHD), predominantly inattentive type -     amphetamine-dextroamphetamine (ADDERALL XR) 30 MG 24 hr capsule; Take 1 capsule (30 mg total) by mouth every morning. -     amphetamine-dextroamphetamine (ADDERALL XR) 30 MG 24 hr capsule; Take 1 capsule (30 mg total) by mouth every morning. -     amphetamine-dextroamphetamine (ADDERALL XR) 30 MG 24 hr capsule; Take 1 capsule (30 mg total) by mouth every morning.  Hypothyroidism, unspecified type -     levothyroxine (SYNTHROID, LEVOTHROID) 150 MCG tablet; Take 1 tablet (150 mcg total) by mouth daily before breakfast.  MDD (major depressive disorder), recurrent episode, moderate (HCC)  .Marland Kitchen Depression screen [facility] 2/9 09/18/2018 02/25/2018 01/21/2018 11/25/2017 04/21/2017  Decreased Interest 0 2 1 0 1  Down, Depressed, Hopeless 0 2 2 1 1   PHQ - 2 Score 0 4 3 1 2   Altered sleeping 1 2 1 2  0  Tired, decreased energy 1 3 2 2  0  Change in appetite 0 2 0 1 0  Feeling bad or failure about yourself  1 2 1 1  0  Trouble concentrating 1 2 2 1  0  Moving slowly or fidgety/restless 0 1 0 0 0  Suicidal thoughts 0 1 1 0 0  PHQ-9 Score 4 17 10 8 2   Difficult doing work/chores Not difficult at all Very difficult Very  difficult Somewhat difficult -   .. GAD 7 : Generalized Anxiety Score 09/18/2018 02/25/2018 01/21/2018 11/25/2017  Nervous, Anxious, on Edge 1 2 1 1   Control/stop worrying 0 1 0 0  Worry too much - different things 0 2 1 1   Trouble relaxing 0 2 2 0  Restless 0 1 1 0  Easily annoyed or irritable 0 3 2 1   Afraid - awful might happen 0 0 0 0  Total GAD 7 Score 1 11 7 3   Anxiety Difficulty Not difficult at all Somewhat difficult Somewhat difficult Somewhat difficult    Medications refilled. Follow up in 3 months.  TSH ordered and meds will be adjusted as needed.

## 2018-09-19 ENCOUNTER — Encounter: Payer: Self-pay | Admitting: Physician Assistant

## 2018-10-09 ENCOUNTER — Other Ambulatory Visit: Payer: Self-pay | Admitting: Osteopathic Medicine

## 2018-10-09 DIAGNOSIS — F411 Generalized anxiety disorder: Secondary | ICD-10-CM

## 2018-12-16 ENCOUNTER — Other Ambulatory Visit: Payer: Self-pay | Admitting: Physician Assistant

## 2018-12-16 DIAGNOSIS — F331 Major depressive disorder, recurrent, moderate: Secondary | ICD-10-CM

## 2018-12-16 DIAGNOSIS — F411 Generalized anxiety disorder: Secondary | ICD-10-CM

## 2019-01-08 ENCOUNTER — Other Ambulatory Visit: Payer: Self-pay | Admitting: Physician Assistant

## 2019-01-08 DIAGNOSIS — F331 Major depressive disorder, recurrent, moderate: Secondary | ICD-10-CM

## 2019-01-08 DIAGNOSIS — F411 Generalized anxiety disorder: Secondary | ICD-10-CM

## 2019-01-08 NOTE — Telephone Encounter (Signed)
I left a brief VM for patient to call back about a follow up visit. FYI.

## 2019-05-25 ENCOUNTER — Other Ambulatory Visit: Payer: Self-pay

## 2019-05-25 ENCOUNTER — Ambulatory Visit: Payer: BC Managed Care – PPO | Admitting: Physician Assistant

## 2019-05-25 ENCOUNTER — Encounter: Payer: Self-pay | Admitting: Physician Assistant

## 2019-05-25 VITALS — BP 130/85 | HR 135 | Ht 64.0 in | Wt 204.0 lb

## 2019-05-25 DIAGNOSIS — E039 Hypothyroidism, unspecified: Secondary | ICD-10-CM

## 2019-05-25 DIAGNOSIS — F411 Generalized anxiety disorder: Secondary | ICD-10-CM | POA: Diagnosis not present

## 2019-05-25 DIAGNOSIS — F331 Major depressive disorder, recurrent, moderate: Secondary | ICD-10-CM

## 2019-05-25 DIAGNOSIS — R000 Tachycardia, unspecified: Secondary | ICD-10-CM | POA: Diagnosis not present

## 2019-05-25 NOTE — Patient Instructions (Signed)
Sinus Tachycardia  Sinus tachycardia is a kind of fast heartbeat. In sinus tachycardia, the heart beats more than 100 times a minute. Sinus tachycardia starts in a part of the heart called the sinus node. Sinus tachycardia may be harmless, or it may be a sign of a serious condition. What are the causes? This condition may be caused by:  Exercise or exertion.  A fever.  Pain.  Loss of body fluids (dehydration).  Severe bleeding (hemorrhage).  Anxiety and stress.  Certain substances, including: ? Alcohol. ? Caffeine. ? Tobacco and nicotine products. ? Cold medicines. ? Illegal drugs.  Medical conditions including: ? Heart disease. ? An infection. ? An overactive thyroid (hyperthyroidism). ? A lack of red blood cells (anemia). What are the signs or symptoms? Symptoms of this condition include:  A feeling that the heart is beating quickly (palpitations).  Suddenly noticing your heartbeat (cardiac awareness).  Dizziness.  Tiredness (fatigue).  Shortness of breath.  Chest pain.  Nausea.  Fainting. How is this diagnosed? This condition is diagnosed with:  A physical exam.  Other tests, such as: ? Blood tests. ? An electrocardiogram (ECG). This test measures the electrical activity of the heart. ? Ambulatory cardiac monitor. This records your heartbeats for 24 hours or more. You may be referred to a heart specialist (cardiologist). How is this treated? Treatment for this condition depends on the cause or the underlying condition. Treatment may involve:  Treating the underlying condition.  Taking new medicines or changing your current medicines as told by your health care provider.  Making changes to your diet or lifestyle. Follow these instructions at home: Lifestyle   Do not use any products that contain nicotine or tobacco, such as cigarettes and e-cigarettes. If you need help quitting, ask your health care provider.  Do not use illegal drugs, such as  cocaine.  Learn relaxation methods to help you when you get stressed or anxious. These include deep breathing.  Avoid caffeine or other stimulants. Alcohol use   Do not drink alcohol if: ? Your health care provider tells you not to drink. ? You are pregnant, may be pregnant, or are planning to become pregnant.  If you drink alcohol, limit how much you have: ? 0-1 drink a day for women. ? 0-2 drinks a day for men.  Be aware of how much alcohol is in your drink. In the [place] one drink equals one typical bottle of beer (12 oz), one-half glass of wine (5 oz), or one shot of hard liquor (1 oz). General instructions  Drink enough fluids to keep your urine pale yellow.  Take over-the-counter and prescription medicines only as told by your health care provider.  Keep all follow-up visits as told by your health care provider. This is important. Contact a health care provider if you have:  A fever.  Vomiting or diarrhea that does not go away. Get help right away if you:  Have pain in your chest, upper arms, jaw, or neck.  Become weak or dizzy.  Feel faint.  Have palpitations that do not go away. Summary  In sinus tachycardia, the heart beats more than 100 times a minute.  Sinus tachycardia may be harmless, or it may be a sign of a serious condition.  Treatment for this condition depends on the cause or the underlying condition.  Get help right away if you have pain in your chest, upper arms, jaw, or neck. This information is not intended to replace advice given to you by   your health care provider. Make sure you discuss any questions you have with your health care provider. Document Released: 10/03/2004 Document Revised: 10/15/2017 Document Reviewed: 10/15/2017 Elsevier Patient Education  2020 [employer]  

## 2019-05-25 NOTE — Progress Notes (Signed)
Subjective:    Patient ID: Lydia Gregory, female    DOB: 01-14-1991, 28 y.o.   MRN: [id number]  HPI  Pt is a 28 yo female with ADHD, MDD, GAD, hypothyroidism who presents to the clinic with elevated HR since march 2020. She denies any CP, palpitations, headaches, edema, SOB at rest. She has noticed some SOB with exertion. She stopped her adderall and made no difference. She stopped her lamictal from march to august with no difference in HR. Her HR ranging via apple watch 110s-130's most of the time. Her TSH was last check 4/22 and 12.10. thyroid medication was increased to 150mcg. She is taking daily. She is not sexually active. Her anxiety and depression have been much better since starting lamictal. She has gained 13lbs since last visit. No fever, chills, dysuria, urinary frequency, flank pain.   .. Active Ambulatory Problems    Diagnosis Date Noted  . Class 1 obesity due to excess calories without serious comorbidity with body mass index (BMI) of 33.0 to 33.9 in adult 08/20/2016  . Vitamin D deficiency 08/20/2016  . MDD (major depressive disorder), recurrent episode, moderate ([place]) 08/20/2016  . GAD (generalized anxiety disorder) 08/20/2016  . No energy 01/02/2017  . Hypothyroidism 01/02/2017  . Inattention 01/02/2017  . Attention deficit hyperactivity disorder (ADHD), predominantly inattentive type 01/02/2017  . Transaminitis 12/31/2017  . Sinus tachycardia 05/25/2019   Resolved Ambulatory Problems    Diagnosis Date Noted  . No Resolved Ambulatory Problems   Past Medical History:  Diagnosis Date  . Depressed      Review of Systems See HPI.     Objective:   Physical Exam Vitals signs reviewed.  Constitutional:      Appearance: Normal appearance. She is obese.  HENT:     Head: Normocephalic.     Nose: Nose normal.     Mouth/Throat:     Mouth: Mucous membranes are moist.  Eyes:     Conjunctiva/sclera: Conjunctivae normal.  Neck:     Musculoskeletal: Normal range of  motion and neck supple.     Comments: No thyroid enlargement or tenderness.  Cardiovascular:     Rate and Rhythm: Regular rhythm. Tachycardia present.     Pulses: Normal pulses.     Heart sounds: No murmur.  Pulmonary:     Effort: Pulmonary effort is normal.     Breath sounds: Normal breath sounds.  Musculoskeletal:     Right lower leg: No edema.     Left lower leg: No edema.  Neurological:     General: No focal deficit present.     Mental Status: She is alert and oriented to person, place, and time.  Psychiatric:        Mood and Affect: Mood normal.           Assessment & Plan:  Marland KitchenMarland KitchenJamae was seen today for tachycardia.  Diagnoses and all orders for this visit:  Sinus tachycardia -     TSH -     T4, free -     T3, free -     CBC -     Fe+TIBC+Fer -     COMPLETE METABOLIC PANEL WITH GFR -     Thyroid Peroxidase Antibodies (TPO) (REFL) -     Lamotrigine level -     Thyroglobulin antibody  Hypothyroidism, unspecified type -     TSH -     T4, free -     T3, free  GAD (generalized anxiety disorder)  MDD (  major depressive disorder), recurrent episode, moderate ([facility])  .Marland Kitchen. [facility] 05/25/2019 09/18/2018 02/25/2018 01/21/2018 11/25/2017  Decreased Interest 0 0 2 1 0  Down, Depressed, Hopeless 0 0 2 2 1   PHQ - 2 Score 0 0 4 3 1   Altered sleeping 1 1 2 1 2   Tired, decreased energy 2 1 3 2 2   Change in appetite 1 0 2 0 1  Feeling bad or failure about yourself  0 1 2 1 1   Trouble concentrating 2 1 2 2 1   Moving slowly or fidgety/restless 0 0 1 0 0  Suicidal thoughts 0 0 1 1 0  PHQ-9 Score 6 4 17 10 8   Difficult doing work/chores Not difficult at all Not difficult at all Very difficult Very difficult Somewhat difficult   .Marland Kitchen. GAD 7 : Generalized Anxiety Score 05/25/2019 09/18/2018 02/25/2018 01/21/2018  Nervous, Anxious, on Edge 2 1 2 1   Control/stop worrying 0 0 1 0  Worry too much - different things 1 0 2 1  Trouble relaxing 0 0 2 2  Restless 1 0 1 1   Easily annoyed or irritable 2 0 3 2  Afraid - awful might happen 0 0 0 0  Total GAD 7 Score 6 1 11 7   Anxiety Difficulty Not difficult at all Not difficult at all Somewhat difficult Somewhat difficult     HR 120's to 130 during office visit. Her previous baseline was high 80's to low 100's.  EKG showed sinus tachycardia but pwave was present. No other arrhthymias.  Will check labs for thyroid function, kidney function, anemia.  Her mood is good. I do not think anxiety.  She is not on adderall.  I suspect her thyroid hormones are out of whack. Will adjust medication accordingly.  She has gained 13lbs but no edema seen on exam. She is SOB with exertion. If labs normal will get echo of heart.  BP pre-hypertensive. Could consider secondary hypertension work up.  Pt does not smoke. Limit any alcohol and caffeine.  Do not exercise until can get heart rate down.  Declined BB for now but will consider if not able to find a root cause.  No family hx of arrhthymias/heart disease.   Follow up in 1 month.

## 2019-05-26 ENCOUNTER — Other Ambulatory Visit: Payer: Self-pay | Admitting: Neurology

## 2019-05-26 DIAGNOSIS — R7989 Other specified abnormal findings of blood chemistry: Secondary | ICD-10-CM

## 2019-05-26 DIAGNOSIS — E039 Hypothyroidism, unspecified: Secondary | ICD-10-CM

## 2019-05-26 MED ORDER — LEVOTHYROXINE SODIUM 137 MCG PO TABS: 137.0000 ug | ORAL_TABLET | Freq: Every day | ORAL | 1 refills | Status: DC

## 2019-05-26 NOTE — Progress Notes (Signed)
Your TSH has swung the pendulum and now showing suppression which means your active thyroid is leaning towards HYPER thyroid and over supplemenation. Your free levels are NOT out of range which is a good thing but appears your body is responding to those levels. Decreased levothyroxine to 163mcg recheck labs in 4 weeks.   Not anemic.  Liver enzymes normalized.   Still awaiting some antibody labs and lamictal level.   If notice any swelling, HR increasing above 140 at rest difficultly breathing while laying down, increased SOB let me know.

## 2019-05-26 NOTE — Progress Notes (Signed)
No thyroid antibodies.

## 2019-05-26 NOTE — Addendum Note (Signed)
Addended by: Donella Stade on: 05/26/2019 06:39 AM   Modules accepted: Orders

## 2019-05-28 LAB — T4, FREE: Free T4: 1.4 ng/dL (ref 0.8–1.8)

## 2019-05-28 LAB — IRON,TIBC AND FERRITIN PANEL
%SAT: 24 % (calc) (ref 16–45)
Ferritin: 34 ng/mL (ref 16–154)
Iron: 93 ug/dL (ref 40–190)
TIBC: 383 mcg/dL (calc) (ref 250–450)

## 2019-05-28 LAB — COMPLETE METABOLIC PANEL WITH GFR
AG Ratio: 1.8 (calc) (ref 1.0–2.5)
ALT: 22 U/L (ref 6–29)
AST: 16 U/L (ref 10–30)
Albumin: 4.6 g/dL (ref 3.6–5.1)
Alkaline phosphatase (APISO): 76 U/L (ref 31–125)
BUN: 7 mg/dL (ref 7–25)
CO2: 25 mmol/L (ref 20–32)
Calcium: 10.2 mg/dL (ref 8.6–10.2)
Chloride: 103 mmol/L (ref 98–110)
Creat: 0.75 mg/dL (ref 0.50–1.10)
GFR, Est African American: 127 mL/min/{1.73_m2} (ref >=60)
GFR, Est Non African American: 110 mL/min/{1.73_m2} (ref >=60)
Globulin: 2.6 g/dL (calc) (ref 1.9–3.7)
Glucose, Bld: 94 mg/dL (ref 65–139)
Potassium: 4.2 mmol/L (ref 3.5–5.3)
Sodium: 138 mmol/L (ref 135–146)
Total Bilirubin: 1.2 mg/dL (ref 0.2–1.2)
Total Protein: 7.2 g/dL (ref 6.1–8.1)

## 2019-05-28 LAB — CBC
HCT: 40.1 % (ref 35.0–45.0)
Hemoglobin: 14.1 g/dL (ref 11.7–15.5)
MCH: 30.9 pg (ref 27.0–33.0)
MCHC: 35.2 g/dL (ref 32.0–36.0)
MCV: 87.9 fL (ref 80.0–100.0)
MPV: 10.2 fL (ref 7.5–12.5)
Platelets: 321 10*3/uL (ref 140–400)
RBC: 4.56 10*6/uL (ref 3.80–5.10)
RDW: 12 % (ref 11.0–15.0)
WBC: 8.7 10*3/uL (ref 3.8–10.8)

## 2019-05-28 LAB — THYROGLOBULIN ANTIBODY: Thyroglobulin Ab: <1 IU/mL (ref <=1)

## 2019-05-28 LAB — TSH: TSH: 0.08 mIU/L — ABNORMAL LOW

## 2019-05-28 LAB — LAMOTRIGINE LEVEL: Lamotrigine Lvl: 1.4 ug/mL — ABNORMAL LOW (ref 4.0–18.0)

## 2019-05-28 LAB — THYROID PEROXIDASE ANTIBODIES (TPO) (REFL): Thyroperoxidase Ab SerPl-aCnc: 3 IU/mL (ref <9)

## 2019-05-28 LAB — T3, FREE: T3, Free: 3.3 pg/mL (ref 2.3–4.2)

## 2019-05-28 NOTE — Progress Notes (Signed)
Lamictal level not quite therapeutic but if your mood is good do not suggest any changes.

## 2019-06-14 NOTE — Addendum Note (Signed)
Addended by: Alena Bills R on: 06/14/2019 03:41 PM   Modules accepted: Orders

## 2019-07-20 ENCOUNTER — Other Ambulatory Visit: Payer: Self-pay | Admitting: Physician Assistant

## 2019-09-12 ENCOUNTER — Other Ambulatory Visit: Payer: Self-pay | Admitting: Physician Assistant

## 2019-10-22 ENCOUNTER — Other Ambulatory Visit: Payer: Self-pay | Admitting: Physician Assistant

## 2019-10-22 ENCOUNTER — Encounter: Payer: Self-pay | Admitting: Physician Assistant

## 2019-10-22 DIAGNOSIS — E039 Hypothyroidism, unspecified: Secondary | ICD-10-CM

## 2019-10-22 MED ORDER — LEVOTHYROXINE SODIUM 137 MCG PO TABS: 137.0000 ug | ORAL_TABLET | Freq: Every day | ORAL | 0 refills | Status: DC

## 2019-11-24 LAB — TSH: TSH: 0.46 mIU/L

## 2019-11-25 ENCOUNTER — Encounter: Payer: Self-pay | Admitting: Physician Assistant

## 2019-11-25 NOTE — Telephone Encounter (Signed)
Psalms,   You are barely in normal range but you are. More to the HYPER thyroid side. How do you feel? We could keep you the same or decrease a little to get you to optimal 1-2 range.

## 2019-11-26 MED ORDER — LEVOTHYROXINE SODIUM 125 MCG PO TABS: 125.0000 ug | ORAL_TABLET | Freq: Every day | ORAL | 1 refills | Status: DC

## 2019-11-27 ENCOUNTER — Other Ambulatory Visit: Payer: Self-pay | Admitting: Physician Assistant

## 2020-01-11 ENCOUNTER — Encounter: Payer: Self-pay | Admitting: Physician Assistant

## 2020-01-11 NOTE — Telephone Encounter (Signed)
Patient scheduled.

## 2020-01-12 ENCOUNTER — Encounter: Payer: Self-pay | Admitting: Family Medicine

## 2020-01-12 ENCOUNTER — Ambulatory Visit (INDEPENDENT_AMBULATORY_CARE_PROVIDER_SITE_OTHER): Payer: BC Managed Care – PPO

## 2020-01-12 ENCOUNTER — Ambulatory Visit (INDEPENDENT_AMBULATORY_CARE_PROVIDER_SITE_OTHER): Payer: BC Managed Care – PPO | Admitting: Family Medicine

## 2020-01-12 ENCOUNTER — Other Ambulatory Visit: Payer: Self-pay

## 2020-01-12 VITALS — BP 120/79 | HR 108 | Temp 99.3°F | Ht 64.17 in | Wt 197.1 lb

## 2020-01-12 DIAGNOSIS — M549 Dorsalgia, unspecified: Secondary | ICD-10-CM | POA: Insufficient documentation

## 2020-01-12 DIAGNOSIS — R350 Frequency of micturition: Secondary | ICD-10-CM

## 2020-01-12 LAB — POCT URINALYSIS DIP (CLINITEK)
Bilirubin, UA: NEGATIVE
Blood, UA: NEGATIVE
Glucose, UA: NEGATIVE mg/dL
Ketones, POC UA: NEGATIVE mg/dL
Nitrite, UA: NEGATIVE
POC PROTEIN,UA: NEGATIVE
Spec Grav, UA: 1.02 (ref 1.010–1.025)
Urobilinogen, UA: 0.2 E.U./dL
pH, UA: 7 (ref 5.0–8.0)

## 2020-01-12 MED ORDER — NITROFURANTOIN MONOHYD MACRO 100 MG PO CAPS: 100.0000 mg | ORAL_CAPSULE | Freq: Two times a day (BID) | ORAL | 0 refills | Status: AC

## 2020-01-12 NOTE — Patient Instructions (Signed)
Very nice to meet you today! I have sent in an antibiotic to cover for urinary tract infection Have xray completed to evaluate for stone. We'll be in touch with results.

## 2020-01-12 NOTE — Assessment & Plan Note (Signed)
Back pain with urinary frequency.  UA with leukocytes, will cover for for UTI with macrobid. Urine sent for culture.  KUB ordered as she has history of stones.  DDx includes MSK cause as well.  Discussed that if culture and xray are negative and pain and urinary frequency persist we may need to obtain CT abdomen.

## 2020-01-12 NOTE — Progress Notes (Signed)
Lydia Gregory - 30 y.o. female MRN [id number]  Date of birth: [birth year]  Subjective Chief Complaint  Patient presents with  . Back Pain    HPI Lydia Gregory is a 29 y.o. female here today with complaint of back pain.  She reports that pain started about 3 days ago.  Pain located around L thoracolumbar area.  Some mild pain on the R side as well. Pain waxes and wanes from mild to severe in intensity.  She has had urinary frequency.  She denies dysuria or hematuria.  She has had kidney stone in the past.  She has not had fever, chills. She has tried ice/heat, movement, rest and nothing seems to help very much.  She denies radiation of pain, numbness, tingling or weakness.   ROS:  A comprehensive ROS was completed and negative except as noted per HPI  Allergies  Allergen Reactions  . Amoxicillin   . Penicillins   . Septra [Sulfamethoxazole-Trimethoprim]     Past Medical History:  Diagnosis Date  . Depressed     Past Surgical History:  Procedure Laterality Date  . KIDNEY STONE SURGERY    . WISDOM TOOTH EXTRACTION      Social History   Socioeconomic History  . Marital status: Single    Spouse name: Not on file  . Number of children: Not on file  . Years of education: Not on file  . Highest education level: Not on file  Occupational History  . Not on file  Tobacco Use  . Smoking status: Never Smoker  . Smokeless tobacco: Never Used  Substance and Sexual Activity  . Alcohol use: Yes    Alcohol/week: 3.0 standard drinks    Types: 3 Glasses of wine per week  . Drug use: No  . Sexual activity: Not on file  Other Topics Concern  . Not on file  Social History Narrative  . Not on file   Social Determinants of Health   Financial Resource Strain:   . Difficulty of Paying Living Expenses:   Food Insecurity:   . Worried About Programme researcher, broadcasting/film/video in the Last Year:   . Barista in the Last Year:   Transportation Needs:   . Freight forwarder (Medical):    Marland Kitchen Lack of Transportation (Non-Medical):   Physical Activity:   . Days of Exercise per Week:   . Minutes of Exercise per Session:   Stress:   . Feeling of Stress :   Social Connections:   . Frequency of Communication with Friends and Family:   . Frequency of Social Gatherings with Friends and Family:   . Attends Religious Services:   . Active Member of Clubs or Organizations:   . Attends Banker Meetings:   Marland Kitchen Marital Status:     Family History  Problem Relation Age of Onset  . Diabetes Mother   . Hypertension Mother   . Hypertension Father     Health Maintenance  Topic Date Due  . PAP-Cervical Cytology Screening  Never done  . PAP SMEAR-Modifier  Never done  . HIV Screening  08/20/2021 (Originally 05/18/2008)  . INFLUENZA VACCINE  04/09/2020  . TETANUS/TDAP  01/09/2027  . COVID-19 Vaccine  Completed     ----------------------------------------------------------------------------------------------------------------------------------------------------------------------------------------------------------------- Physical Exam BP 120/79 (BP Location: Left Arm, Patient Position: Sitting, Cuff Size: Normal)   Pulse (!) 108   Temp 99.3 F (37.4 C) (Oral)   Ht 5' 4.17" (1.63 m)   Wt 197 lb 1.6 oz (89.4  kg)   SpO2 95%   BMI 33.65 kg/m   Physical Exam Constitutional:      Appearance: Normal appearance.  HENT:     Head: Atraumatic.     Right Ear: Tympanic membrane normal.  Eyes:     General: No scleral icterus. Cardiovascular:     Rate and Rhythm: Normal rate and regular rhythm.  Pulmonary:     Effort: Pulmonary effort is normal.     Breath sounds: Normal breath sounds.  Abdominal:     General: Abdomen is flat.     Tenderness: There is no right CVA tenderness.     Comments: Mild L CVA tenderness.   Musculoskeletal:     Cervical back: Neck supple.  Skin:    General: Skin is warm and dry.  Neurological:     General: No focal deficit present.      Mental Status: She is alert.  Psychiatric:        Mood and Affect: Mood normal.        Behavior: Behavior normal.     ------------------------------------------------------------------------------------------------------------------------------------------------------------------------------------------------------------------- Assessment and Plan  Back pain Back pain with urinary frequency.  UA with leukocytes, will cover for for UTI with macrobid. Urine sent for culture.  KUB ordered as she has history of stones.  DDx includes MSK cause as well.  Discussed that if culture and xray are negative and pain and urinary frequency persist we may need to obtain CT abdomen.     Meds ordered this encounter  Medications  . nitrofurantoin, macrocrystal-monohydrate, (MACROBID) 100 MG capsule    Sig: Take 1 capsule (100 mg total) by mouth 2 (two) times daily for 7 days.    Dispense:  14 capsule    Refill:  0    No follow-ups on file.    This visit occurred during the SARS-CoV-2 public health emergency.  Safety protocols were in place, including screening questions prior to the visit, additional usage of staff PPE, and extensive cleaning of exam room while observing appropriate contact time as indicated for disinfecting solutions.

## 2020-01-14 LAB — URINE CULTURE
MICRO NUMBER:: 10443032
SPECIMEN QUALITY:: ADEQUATE

## 2020-01-24 ENCOUNTER — Encounter: Payer: Self-pay | Admitting: Family Medicine

## 2020-01-24 ENCOUNTER — Ambulatory Visit (INDEPENDENT_AMBULATORY_CARE_PROVIDER_SITE_OTHER): Payer: BC Managed Care – PPO | Admitting: Family Medicine

## 2020-01-24 ENCOUNTER — Telehealth: Payer: Self-pay

## 2020-01-24 DIAGNOSIS — M549 Dorsalgia, unspecified: Secondary | ICD-10-CM

## 2020-01-24 MED ORDER — NAPROXEN 500 MG PO TABS: 500.0000 mg | ORAL_TABLET | Freq: Two times a day (BID) | ORAL | 0 refills | Status: DC | PRN

## 2020-01-24 MED ORDER — CYCLOBENZAPRINE HCL 10 MG PO TABS: 5.0000 mg | ORAL_TABLET | Freq: Three times a day (TID) | ORAL | 0 refills | Status: DC | PRN

## 2020-01-24 NOTE — Progress Notes (Signed)
Lydia Gregory states that after her last visit the pain went away. She was only taking ibuprofen.   Yesterday she started to have pain again.  This morning the pain has increased.

## 2020-01-24 NOTE — Progress Notes (Signed)
Lydia Gregory - 29 y.o. female MRN [id number]  Date of birth: March 23, 1991  Subjective Chief Complaint  Patient presents with  . Back Pain    HPI Lydia Gregory is a 29 y.o. female here today for follow up of back pain.  I last saw her about two weeks ago for back pain.  Pain located on L side.  Describes as waxing and waning.  History of kidney stone and had some urinary frequency.  Previous work up including UA and culture which were negative and xray that did not show stone.  Pain went away on its own after a couple of days but returned yesterday, this time more intense that last.  She denies urinary symptoms, blood in urine, vaginal discharge or bleeding.  She has nexplanon for contraception.  Bowels are normal.  Pain does not radiate but is made worse by movement.  Bending and twisting seem to make this worse.  She has not had fever or chills.   ROS:  A comprehensive ROS was completed and negative except as noted per HPI  Allergies  Allergen Reactions  . Amoxicillin   . Penicillins   . Septra [Sulfamethoxazole-Trimethoprim]     Past Medical History:  Diagnosis Date  . Depressed     Past Surgical History:  Procedure Laterality Date  . KIDNEY STONE SURGERY    . WISDOM TOOTH EXTRACTION      Social History   Socioeconomic History  . Marital status: Single    Spouse name: Not on file  . Number of children: Not on file  . Years of education: Not on file  . Highest education level: Not on file  Occupational History  . Not on file  Tobacco Use  . Smoking status: Never Smoker  . Smokeless tobacco: Never Used  Substance and Sexual Activity  . Alcohol use: Yes    Alcohol/week: 3.0 standard drinks    Types: 3 Glasses of wine per week  . Drug use: No  . Sexual activity: Not on file  Other Topics Concern  . Not on file  Social History Narrative  . Not on file   Social Determinants of Health   Financial Resource Strain:   . Difficulty of Paying Living Expenses:    Food Insecurity:   . Worried About Charity fundraiser in the Last Year:   . Arboriculturist in the Last Year:   Transportation Needs:   . Film/video editor (Medical):   Marland Kitchen Lack of Transportation (Non-Medical):   Physical Activity:   . Days of Exercise per Week:   . Minutes of Exercise per Session:   Stress:   . Feeling of Stress :   Social Connections:   . Frequency of Communication with Friends and Family:   . Frequency of Social Gatherings with Friends and Family:   . Attends Religious Services:   . Active Member of Clubs or Organizations:   . Attends Archivist Meetings:   Marland Kitchen Marital Status:     Family History  Problem Relation Age of Onset  . Diabetes Mother   . Hypertension Mother   . Hypertension Father     Health Maintenance  Topic Date Due  . PAP-Cervical Cytology Screening  Never done  . PAP SMEAR-Modifier  Never done  . HIV Screening  08/20/2021 (Originally 05/18/2008)  . INFLUENZA VACCINE  04/09/2020  . TETANUS/TDAP  01/09/2027  . COVID-19 Vaccine  Completed     ----------------------------------------------------------------------------------------------------------------------------------------------------------------------------------------------------------------- Physical Exam BP (!) 162/107 (BP  Location: Left Arm, Patient Position: Sitting, Cuff Size: Large)   Pulse 77   Ht 5' 4.17" (1.63 m)   Wt 199 lb 3.2 oz (90.4 kg)   SpO2 100%   BMI 34.01 kg/m   Physical Exam Constitutional:      Appearance: Normal appearance.  Cardiovascular:     Rate and Rhythm: Normal rate and regular rhythm.  Pulmonary:     Effort: Pulmonary effort is normal.     Breath sounds: Normal breath sounds.  Abdominal:     Tenderness: There is no right CVA tenderness or left CVA tenderness.  Musculoskeletal:     Comments: TTP over L thoracolumbar paraspinals on L.  Increased pain with flexion.  ROM is normal.  Negative SLR.  Normal strength.   Neurological:      General: No focal deficit present.     Mental Status: She is alert.     ------------------------------------------------------------------------------------------------------------------------------------------------------------------------------------------------------------------- Assessment and Plan  Back pain  Seems to be more musculoskeletal based on description and exam findings today.  Start naproxen and flexeril as needed.  Discussed that flexeril may make her drowsy.  Given handout of home stretches.  Previous work up negative for stone or infection and I think this is less likely at this time if her pain isn't improving we may need to get CT.     Meds ordered this encounter  Medications  . naproxen (NAPROSYN) 500 MG tablet    Sig: Take 1 tablet (500 mg total) by mouth 2 (two) times daily as needed. Take with food    Dispense:  30 tablet    Refill:  0  . cyclobenzaprine (FLEXERIL) 10 MG tablet    Sig: Take 0.5-1 tablets (5-10 mg total) by mouth 3 (three) times daily as needed for muscle spasms.    Dispense:  30 tablet    Refill:  0    No follow-ups on file.    This visit occurred during the SARS-CoV-2 public health emergency.  Safety protocols were in place, including screening questions prior to the visit, additional usage of staff PPE, and extensive cleaning of exam room while observing appropriate contact time as indicated for disinfecting solutions.

## 2020-01-24 NOTE — Assessment & Plan Note (Signed)
  Seems to be more musculoskeletal based on description and exam findings today.  Start naproxen and flexeril as needed.  Discussed that flexeril may make her drowsy.  Given handout of home stretches.  Previous work up negative for stone or infection and I think this is less likely at this time if her pain isn't improving we may need to get CT.

## 2020-01-24 NOTE — Telephone Encounter (Signed)
Symptoms have not resolved and is now worse per patient. She has been scheduled for a follow up.

## 2020-01-24 NOTE — Patient Instructions (Signed)
Nice to see you! If not improving with prescribed medications over the next few days let me know.     Low Back Sprain or Strain Rehab Ask your health care provider which exercises are safe for you. Do exercises exactly as told by your health care provider and adjust them as directed. It is normal to feel mild stretching, pulling, tightness, or discomfort as you do these exercises. Stop right away if you feel sudden pain or your pain gets worse. Do not begin these exercises until told by your health care provider. Stretching and range-of-motion exercises These exercises warm up your muscles and joints and improve the movement and flexibility of your back. These exercises also help to relieve pain, numbness, and tingling. Lumbar rotation  1. Lie on your back on a firm surface and bend your knees. 2. Straighten your arms out to your sides so each arm forms a 90-degree angle (right angle) with a side of your body. 3. Slowly move (rotate) both of your knees to one side of your body until you feel a stretch in your lower back (lumbar). Try not to let your shoulders lift off the floor. 4. Hold this position for __________ seconds. 5. Tense your abdominal muscles and slowly move your knees back to the starting position. 6. Repeat this exercise on the other side of your body. Repeat __________ times. Complete this exercise __________ times a day. Single knee to chest  1. Lie on your back on a firm surface with both legs straight. 2. Bend one of your knees. Use your hands to move your knee up toward your chest until you feel a gentle stretch in your lower back and buttock. ? Hold your leg in this position by holding on to the front of your knee. ? Keep your other leg as straight as possible. 3. Hold this position for __________ seconds. 4. Slowly return to the starting position. 5. Repeat with your other leg. Repeat __________ times. Complete this exercise __________ times a day. Prone extension on  elbows  1. Lie on your abdomen on a firm surface (prone position). 2. Prop yourself up on your elbows. 3. Use your arms to help lift your chest up until you feel a gentle stretch in your abdomen and your lower back. ? This will place some of your body weight on your elbows. If this is uncomfortable, try stacking pillows under your chest. ? Your hips should stay down, against the surface that you are lying on. Keep your hip and back muscles relaxed. 4. Hold this position for __________ seconds. 5. Slowly relax your upper body and return to the starting position. Repeat __________ times. Complete this exercise __________ times a day. Strengthening exercises These exercises build strength and endurance in your back. Endurance is the ability to use your muscles for a long time, even after they get tired. Pelvic tilt This exercise strengthens the muscles that lie deep in the abdomen. 1. Lie on your back on a firm surface. Bend your knees and keep your feet flat on the floor. 2. Tense your abdominal muscles. Tip your pelvis up toward the ceiling and flatten your lower back into the floor. ? To help with this exercise, you may place a small towel under your lower back and try to push your back into the towel. 3. Hold this position for __________ seconds. 4. Let your muscles relax completely before you repeat this exercise. Repeat __________ times. Complete this exercise __________ times a day. Alternating arm and  leg raises  1. Get on your hands and knees on a firm surface. If you are on a hard floor, you may want to use padding, such as an exercise mat, to cushion your knees. 2. Line up your arms and legs. Your hands should be directly below your shoulders, and your knees should be directly below your hips. 3. Lift your left leg behind you. At the same time, raise your right arm and straighten it in front of you. ? Do not lift your leg higher than your hip. ? Do not lift your arm higher than your  shoulder. ? Keep your abdominal and back muscles tight. ? Keep your hips facing the ground. ? Do not arch your back. ? Keep your balance carefully, and do not hold your breath. 4. Hold this position for __________ seconds. 5. Slowly return to the starting position. 6. Repeat with your right leg and your left arm. Repeat __________ times. Complete this exercise __________ times a day. Abdominal set with straight leg raise  1. Lie on your back on a firm surface. 2. Bend one of your knees and keep your other leg straight. 3. Tense your abdominal muscles and lift your straight leg up, 4-6 inches (10-15 cm) off the ground. 4. Keep your abdominal muscles tight and hold this position for __________ seconds. ? Do not hold your breath. ? Do not arch your back. Keep it flat against the ground. 5. Keep your abdominal muscles tense as you slowly lower your leg back to the starting position. 6. Repeat with your other leg. Repeat __________ times. Complete this exercise __________ times a day. Single leg lower with bent knees 1. Lie on your back on a firm surface. 2. Tense your abdominal muscles and lift your feet off the floor, one foot at a time, so your knees and hips are bent in 90-degree angles (right angles). ? Your knees should be over your hips and your lower legs should be parallel to the floor. 3. Keeping your abdominal muscles tense and your knee bent, slowly lower one of your legs so your toe touches the ground. 4. Lift your leg back up to return to the starting position. ? Do not hold your breath. ? Do not let your back arch. Keep your back flat against the ground. 5. Repeat with your other leg. Repeat __________ times. Complete this exercise __________ times a day. Posture and body mechanics Good posture and healthy body mechanics can help to relieve stress in your body's tissues and joints. Body mechanics refers to the movements and positions of your body while you do your daily  activities. Posture is part of body mechanics. Good posture means:  Your spine is in its natural S-curve position (neutral).  Your shoulders are pulled back slightly.  Your head is not tipped forward. Follow these guidelines to improve your posture and body mechanics in your everyday activities. Standing   When standing, keep your spine neutral and your feet about hip width apart. Keep a slight bend in your knees. Your ears, shoulders, and hips should line up.  When you do a task in which you stand in one place for a long time, place one foot up on a stable object that is 2-4 inches (5-10 cm) high, such as a footstool. This helps keep your spine neutral. Sitting   When sitting, keep your spine neutral and keep your feet flat on the floor. Use a footrest, if necessary, and keep your thighs parallel to the floor.  Avoid rounding your shoulders, and avoid tilting your head forward.  When working at a desk or a computer, keep your desk at a height where your hands are slightly lower than your elbows. Slide your chair under your desk so you are close enough to maintain good posture.  When working at a computer, place your monitor at a height where you are looking straight ahead and you do not have to tilt your head forward or downward to look at the screen. Resting  When lying down and resting, avoid positions that are most painful for you.  If you have pain with activities such as sitting, bending, stooping, or squatting, lie in a position in which your body does not bend very much. For example, avoid curling up on your side with your arms and knees near your chest (fetal position).  If you have pain with activities such as standing for a long time or reaching with your arms, lie with your spine in a neutral position and bend your knees slightly. Try the following positions: ? Lying on your side with a pillow between your knees. ? Lying on your back with a pillow under your  knees. Lifting   When lifting objects, keep your feet at least shoulder width apart and tighten your abdominal muscles.  Bend your knees and hips and keep your spine neutral. It is important to lift using the strength of your legs, not your back. Do not lock your knees straight out.  Always ask for help to lift heavy or awkward objects. This information is not intended to replace advice given to you by your health care provider. Make sure you discuss any questions you have with your health care provider. Document Revised: 12/18/2018 Document Reviewed: 09/17/2018 Elsevier Patient Education  [place].

## 2020-01-27 ENCOUNTER — Other Ambulatory Visit: Payer: Self-pay | Admitting: Physician Assistant

## 2020-01-27 DIAGNOSIS — E039 Hypothyroidism, unspecified: Secondary | ICD-10-CM

## 2020-02-22 ENCOUNTER — Other Ambulatory Visit: Payer: Self-pay | Admitting: Neurology

## 2020-02-22 DIAGNOSIS — E039 Hypothyroidism, unspecified: Secondary | ICD-10-CM

## 2020-02-22 MED ORDER — LEVOTHYROXINE SODIUM 125 MCG PO TABS: 125.0000 ug | ORAL_TABLET | Freq: Every day | ORAL | 0 refills | Status: DC

## 2020-03-03 ENCOUNTER — Other Ambulatory Visit: Payer: Self-pay | Admitting: Physician Assistant

## 2020-03-03 DIAGNOSIS — E039 Hypothyroidism, unspecified: Secondary | ICD-10-CM

## 2020-04-14 ENCOUNTER — Other Ambulatory Visit: Payer: Self-pay | Admitting: Physician Assistant

## 2020-04-14 DIAGNOSIS — E039 Hypothyroidism, unspecified: Secondary | ICD-10-CM

## 2020-04-17 ENCOUNTER — Ambulatory Visit (INDEPENDENT_AMBULATORY_CARE_PROVIDER_SITE_OTHER): Payer: BC Managed Care – PPO | Admitting: Nurse Practitioner

## 2020-04-17 ENCOUNTER — Encounter: Payer: Self-pay | Admitting: Nurse Practitioner

## 2020-04-17 ENCOUNTER — Other Ambulatory Visit: Payer: Self-pay

## 2020-04-17 VITALS — BP 121/87 | HR 110 | Ht 64.0 in | Wt 198.0 lb

## 2020-04-17 DIAGNOSIS — F411 Generalized anxiety disorder: Secondary | ICD-10-CM | POA: Diagnosis not present

## 2020-04-17 DIAGNOSIS — E039 Hypothyroidism, unspecified: Secondary | ICD-10-CM | POA: Diagnosis not present

## 2020-04-17 DIAGNOSIS — R4702 Dysphasia: Secondary | ICD-10-CM

## 2020-04-17 DIAGNOSIS — R000 Tachycardia, unspecified: Secondary | ICD-10-CM

## 2020-04-17 LAB — THYROID PANEL WITH TSH
Free Thyroxine Index: 2.1 (ref 1.4–3.8)
T3 Uptake: 28 % (ref 22–35)
T4, Total: 7.6 ug/dL (ref 5.1–11.9)
TSH: 6.42 mIU/L — ABNORMAL HIGH

## 2020-04-17 MED ORDER — PROPRANOLOL HCL 10 MG PO TABS: 10.0000 mg | ORAL_TABLET | Freq: Three times a day (TID) | ORAL | 3 refills | Status: DC

## 2020-04-17 NOTE — Patient Instructions (Addendum)
Hypothyroidism  Hypothyroidism is when the thyroid gland does not make enough of certain hormones (it is underactive). The thyroid gland is a small gland located in the lower front part of the neck, just in front of the windpipe (trachea). This gland makes hormones that help control how the body uses food for energy (metabolism) as well as how the heart and brain function. These hormones also play a role in keeping your bones strong. When the thyroid is underactive, it produces too little of the hormones thyroxine (T4) and triiodothyronine (T3). What are the causes? This condition may be caused by:  Hashimoto's disease. This is a disease in which the body's disease-fighting system (immune system) attacks the thyroid gland. This is the most common cause.  Viral infections.  Pregnancy.  Certain medicines.  Birth defects.  Past radiation treatments to the head or neck for cancer.  Past treatment with radioactive iodine.  Past exposure to radiation in the environment.  Past surgical removal of part or all of the thyroid.  Problems with a gland in the center of the brain (pituitary gland).  Lack of enough iodine in the diet. What increases the risk? You are more likely to develop this condition if:  You are female.  You have a family history of thyroid conditions.  You use a medicine called lithium.  You take medicines that affect the immune system (immunosuppressants). What are the signs or symptoms? Symptoms of this condition include:  Feeling as though you have no energy (lethargy).  Not being able to tolerate cold.  Weight gain that is not explained by a change in diet or exercise habits.  Lack of appetite.  Dry skin.  Coarse hair.  Menstrual irregularity.  Slowing of thought processes.  Constipation.  Sadness or depression. How is this diagnosed? This condition may be diagnosed based on:  Your symptoms, your medical history, and a physical exam.  Blood  tests. You may also have imaging tests, such as an ultrasound or MRI. How is this treated? This condition is treated with medicine that replaces the thyroid hormones that your body does not make. After you begin treatment, it may take several weeks for symptoms to go away. Follow these instructions at home:  Take over-the-counter and prescription medicines only as told by your health care provider.  If you start taking any new medicines, tell your health care provider.  Keep all follow-up visits as told by your health care provider. This is important. ? As your condition improves, your dosage of thyroid hormone medicine may change. ? You will need to have blood tests regularly so that your health care provider can monitor your condition. Contact a health care provider if:  Your symptoms do not get better with treatment.  You are taking thyroid replacement medicine and you: ? Sweat a lot. ? Have tremors. ? Feel anxious. ? Lose weight rapidly. ? Cannot tolerate heat. ? Have emotional swings. ? Have diarrhea. ? Feel weak. Get help right away if you have:  Chest pain.  An irregular heartbeat.  A rapid heartbeat.  Difficulty breathing. Summary  Hypothyroidism is when the thyroid gland does not make enough of certain hormones (it is underactive).  When the thyroid is underactive, it produces too little of the hormones thyroxine (T4) and triiodothyronine (T3).  The most common cause is Hashimoto's disease, a disease in which the body's disease-fighting system (immune system) attacks the thyroid gland. The condition can also be caused by viral infections, medicine, pregnancy, or past   radiation treatment to the head or neck.  Symptoms may include weight gain, dry skin, constipation, feeling as though you do not have energy, and not being able to tolerate cold.  This condition is treated with medicine to replace the thyroid hormones that your body does not make. This information  is not intended to replace advice given to you by your health care provider. Make sure you discuss any questions you have with your health care provider. Document Revised: 08/08/2017 Document Reviewed: 08/06/2017 Elsevier Patient Education  2020 [employer]  Propranolol Tablets What is this medicine? PROPRANOLOL (proe PRAN oh lole) is a beta blocker. It decreases the amount of work your heart has to do and helps your heart beat regularly. It treats high blood pressure and/or prevent chest pain (also called angina). It is also used after a heart attack to prevent a second one. This medicine may be used for other purposes; ask your health care provider or pharmacist if you have questions. COMMON BRAND NAME(S): Inderal What should I tell my health care provider before I take this medicine? They need to know if you have any of these conditions:  circulation problems or blood vessel disease  diabetes  history of heart attack or heart disease, vasospastic angina  kidney disease  liver disease  lung or breathing disease, like asthma or emphysema  pheochromocytoma  slow heart rate  thyroid disease  an unusual or allergic reaction to propranolol, other beta-blockers, medicines, foods, dyes, or preservatives  pregnant or trying to get pregnant  breast-feeding How should I use this medicine? Take this drug by mouth. Take it as directed on the prescription label at the same time every day. Keep taking it unless your health care provider tells you to stop. Talk to your health care provider about the use of this drug in children. Special care may be needed. Overdosage: If you think you have taken too much of this medicine contact a poison control center or emergency room at once. NOTE: This medicine is only for you. Do not share this medicine with others. What if I miss a dose? If you miss a dose, take it as soon as you can. If it is almost time for your next dose, take only that dose.  Do not take double or extra doses. What may interact with this medicine? Do not take this medicine with any of the following medications:  feverfew  phenothiazines like chlorpromazine, mesoridazine, prochlorperazine, thioridazine This medicine may also interact with the following medications:  aluminum hydroxide gel  antipyrine  antiviral medicines for HIV or AIDS  barbiturates like phenobarbital  certain medicines for blood pressure, heart disease, irregular heart beat  cimetidine  ciprofloxacin  diazepam  fluconazole  haloperidol  isoniazid  medicines for cholesterol like cholestyramine or colestipol  medicines for mental depression  medicines for migraine headache like almotriptan, eletriptan, frovatriptan, naratriptan, rizatriptan, sumatriptan, zolmitriptan  NSAIDs, medicines for pain and inflammation, like ibuprofen or naproxen  phenytoin  rifampin  teniposide  theophylline  thyroid medicines  tolbutamide  warfarin  zileuton This list may not describe all possible interactions. Give your health care provider a list of all the medicines, herbs, non-prescription drugs, or dietary supplements you use. Also tell them if you smoke, drink alcohol, or use illegal drugs. Some items may interact with your medicine. What should I watch for while using this medicine? Visit your doctor or health care professional for regular check ups. Check your blood pressure and pulse rate regularly. Ask your  health care professional what your blood pressure and pulse rate should be, and when you should contact them. You may get drowsy or dizzy. Do not drive, use machinery, or do anything that needs mental alertness until you know how this drug affects you. Do not stand or sit up quickly, especially if you are an older patient. This reduces the risk of dizzy or fainting spells. Alcohol can make you more drowsy and dizzy. Avoid alcoholic drinks. This medicine may increase blood  sugar. Ask your healthcare provider if changes in diet or medicines are needed if you have diabetes. Do not treat yourself for coughs, colds, or pain while you are taking this medicine without asking your doctor or health care professional for advice. Some ingredients may increase your blood pressure. What side effects may I notice from receiving this medicine? Side effects that you should report to your doctor or health care professional as soon as possible:  allergic reactions like skin rash, itching or hives, swelling of the face, lips, or tongue  breathing problems  cold hands or feet  difficulty sleeping, nightmares  dry peeling skin  hallucinations  muscle cramps or weakness   signs and symptoms of high blood sugar such as being more thirsty or hungry or having to urinate more than normal. You may also feel very tired or have blurry vision.  slow heart rate  swelling of the legs and ankles  vomiting Side effects that usually do not require medical attention (report to your doctor or health care professional if they continue or are bothersome):  change in sex drive or performance  diarrhea  dry sore eyes  hair loss  nausea  weak or tired This list may not describe all possible side effects. Call your doctor for medical advice about side effects. You may report side effects to FDA at 1-800-FDA-1088. Where should I keep my medicine? Keep out of the reach of children and pets. Store at room temperature between 20 and 25 degrees C (68 and 77 degrees F). Protect from light. Throw away any unused drug after the expiration date. NOTE: This sheet is a summary. It may not cover all possible information. If you have questions about this medicine, talk to your doctor, pharmacist, or health care provider.  2020 Elsevier/[employer] Standard (2019-04-02 19:25:51)

## 2020-04-17 NOTE — Progress Notes (Signed)
Established Patient Office Visit  Subjective:  Patient ID: Lydia Gregory, female    DOB: 1990/11/11  Age: 29 y.o. MRN: [id number]  CC:  Chief Complaint  Patient presents with  . Thyroid Problem  . Tachycardia    HPI Lydia Gregory presents to discuss thyroid.   She has been experieincing and elevated heart rate since about January of 2020. She denies palpitations but does report that she can feel her heart beating fast at times.   [facility]- they changed her medications around to see if they could get the high heart rate decreased and none of the changes or coming off of the medications have helped decreasing this.   No changes with the thyroid noticed. She does report that since having strep last year she is having some difficulty swallowing. She reports that every time she swallows she has to consciously think about swallowing in order to pass the bolus. She does feel that food feels as though it will get stuck in her throat. She reports that she is unable to eat dry bread any longer because it does get lodged in her esophagus and takes great difficulty to move the bolus.  She does report that she has noticed that her hair has thinned more than normal. She reports that she is always tired, but no change from the past.  She denies weight loss or weight gain, diarrhea or constipation.   Past Medical History:  Diagnosis Date  . Depressed     Past Surgical History:  Procedure Laterality Date  . KIDNEY STONE SURGERY    . WISDOM TOOTH EXTRACTION      Family History  Problem Relation Age of Onset  . Diabetes Mother   . Hypertension Mother   . Hypertension Father     Social History   Socioeconomic History  . Marital status: Single    Spouse name: Not on file  . Number of children: Not on file  . Years of education: Not on file  . Highest education level: Not on file  Occupational History  . Not on file  Tobacco Use  . Smoking status:  Never Smoker  . Smokeless tobacco: Never Used  Vaping Use  . Vaping Use: Never used  Substance and Sexual Activity  . Alcohol use: Yes    Alcohol/week: 3.0 standard drinks    Types: 3 Glasses of wine per week  . Drug use: No  . Sexual activity: Not on file  Other Topics Concern  . Not on file  Social History Narrative  . Not on file   Social Determinants of Health   Financial Resource Strain:   . Difficulty of Paying Living Expenses:   Food Insecurity:   . Worried About Programme researcher, broadcasting/film/video in the Last Year:   . Barista in the Last Year:   Transportation Needs:   . Freight forwarder (Medical):   Marland Kitchen Lack of Transportation (Non-Medical):   Physical Activity:   . Days of Exercise per Week:   . Minutes of Exercise per Session:   Stress:   . Feeling of Stress :   Social Connections:   . Frequency of Communication with Friends and Family:   . Frequency of Social Gatherings with Friends and Family:   . Attends Religious Services:   . Active Member of Clubs or Organizations:   . Attends Banker Meetings:   Marland Kitchen Marital Status:   Intimate Partner Violence:   . Fear  of Current or Ex-Partner:   . Emotionally Abused:   Marland Kitchen Physically Abused:   . Sexually Abused:     Outpatient Medications Prior to Visit  Medication Sig Dispense Refill  . ALPRAZolam (XANAX) 0.5 MG tablet TAKE 1 TABLET BY MOUTH TWICE A DAY AS NEEDED FOR ANXIETY 30 tablet 0  . busPIRone (BUSPAR) 10 MG tablet TAKE 0.5 TABLETS (5 MG TOTAL) BY MOUTH 2 (TWO) TIMES DAILY. DUE FOR APPT, PLEASE CALL TO SCHEDULE VIRTUAL VISIT 90 tablet 0  . etonogestrel (NEXPLANON) 68 MG IMPL implant Inserted 12/31/17 Lot# [id number] 1 each 0  . fexofenadine (ALLEGRA) 180 MG tablet Take 180 mg by mouth daily.    Marland Kitchen FLUoxetine (PROZAC) 20 MG capsule Take 20 mg by mouth every morning.    . lamoTRIgine (LAMICTAL) 150 MG tablet Take 150 mg by mouth daily.    Marland Kitchen levothyroxine (SYNTHROID) 125 MCG tablet TAKE 1 TABLET (125 MCG  TOTAL) BY MOUTH DAILY. NEEDS LABS 7 tablet 0  . Probiotic Product (PROBIOTIC-10 PO) Take by mouth daily.    Marland Kitchen spironolactone (ALDACTONE) 50 MG tablet Take 150 mg by mouth daily.     Marland Kitchen VYVANSE 40 MG capsule Take 40 mg by mouth every morning.    Marland Kitchen amphetamine-dextroamphetamine (ADDERALL) 20 MG tablet Take 20 mg by mouth 2 (two) times daily.    . cyclobenzaprine (FLEXERIL) 10 MG tablet Take 0.5-1 tablets (5-10 mg total) by mouth 3 (three) times daily as needed for muscle spasms. 30 tablet 0  . lamoTRIgine (LAMICTAL) 25 MG tablet Take 50 mg by mouth daily.    . naproxen (NAPROSYN) 500 MG tablet Take 1 tablet (500 mg total) by mouth 2 (two) times daily as needed. Take with food 30 tablet 0   No facility-administered medications prior to visit.    Allergies  Allergen Reactions  . Amoxicillin   . Penicillins   . Septra [Sulfamethoxazole-Trimethoprim]       Objective:    Physical Exam Vitals and nursing note reviewed.  Constitutional:      Appearance: Normal appearance.  HENT:     Head: Normocephalic.     Mouth/Throat:     Mouth: Mucous membranes are moist.     Pharynx: Oropharynx is clear. No oropharyngeal exudate or posterior oropharyngeal erythema.  Eyes:     Extraocular Movements: Extraocular movements intact.     Conjunctiva/sclera: Conjunctivae normal.     Pupils: Pupils are equal, round, and reactive to light.  Neck:     Thyroid: Thyromegaly present.     Vascular: No carotid bruit.     Trachea: Trachea and phonation normal.  Cardiovascular:     Rate and Rhythm: Tachycardia present.     Pulses: Normal pulses.     Heart sounds: Normal heart sounds.  Pulmonary:     Effort: Pulmonary effort is normal.     Breath sounds: Normal breath sounds.  Abdominal:     General: Abdomen is flat. Bowel sounds are normal.     Palpations: Abdomen is soft.  Musculoskeletal:        General: Normal range of motion.     Cervical back: Normal range of motion and neck supple. No rigidity or  tenderness.  Lymphadenopathy:     Cervical: No cervical adenopathy.  Skin:    General: Skin is warm and dry.     Capillary Refill: Capillary refill takes less than 2 seconds.  Neurological:     General: No focal deficit present.     Mental Status: She  is alert and oriented to person, place, and time.  Psychiatric:        Mood and Affect: Mood normal.        Behavior: Behavior normal.        Thought Content: Thought content normal.        Judgment: Judgment normal.     BP 121/87   Pulse (!) 110   Ht 5\' 4"  (1.626 m)   Wt 198 lb (89.8 kg)   SpO2 96%   BMI 33.99 kg/m  Wt Readings from Last 3 Encounters:  04/17/20 198 lb (89.8 kg)  01/24/20 199 lb 3.2 oz (90.4 kg)  01/12/20 197 lb 1.6 oz (89.4 kg)     Health Maintenance Due  Topic Date Due  . Hepatitis C Screening  Never done  . INFLUENZA VACCINE  04/09/2020    There are no preventive care reminders to display for this patient.  Lab Results  Component Value Date   TSH 0.46 11/24/2019   Lab Results  Component Value Date   WBC 8.7 05/25/2019   HGB 14.1 05/25/2019   HCT 40.1 05/25/2019   MCV 87.9 05/25/2019   PLT 321 05/25/2019   Lab Results  Component Value Date   NA 138 05/25/2019   K 4.2 05/25/2019   CO2 25 05/25/2019   GLUCOSE 94 05/25/2019   BUN 7 05/25/2019   CREATININE 0.75 05/25/2019   BILITOT 1.2 05/25/2019   ALKPHOS 78 03/24/2017   AST 16 05/25/2019   ALT 22 05/25/2019   PROT 7.2 05/25/2019   ALBUMIN 4.2 03/24/2017   CALCIUM 10.2 05/25/2019   No results found for: CHOL No results found for: HDL No results found for: LDLCALC No results found for: TRIG No results found for: CHOLHDL No results found for: [id number]    Assessment & Plan:   1. Hypothyroidism, unspecified type Will recheck TSH with T3 and T4 today.  If medication changes are necessary, will advise based on labs and send refill at that time- pt verbalized she has enough medication remaining to last until labs come back.  Will  start propranolol 10mg  TID for tachycardia- tapering dose from once per day up to 3 times per day over 1 week.  Her thyroid does feel enlarged today and I do feel that this could be interfering with her ability to swallow. Discussed that we may need to send her to a specialist for further evaluation if the swallowing difficulties continue.  - Thyroid Panel With TSH - propranolol (INDERAL) 10 MG tablet; Take 1 tablet (10 mg total) by mouth 3 (three) times daily. To start- Take 1 tab at bedtime for 3 days then increase to 1 tab at bedtime and 1 tab in the morning for three day. Then increase to 1 tab every 8 hours (3 times a day).  Dispense: 90 tablet; Refill: 3  2. Sinus tachycardia Sinus tachycardia in the setting of hypothyroidism on medication management.  We will draw labs today to ensure that she has not transitioned over to hyperthyroidism.  Medication adjustments as necessary. Will start propranolol 10mg  TID today with a one week taper to max dose.  - Thyroid Panel With TSH - propranolol (INDERAL) 10 MG tablet; Take 1 tablet (10 mg total) by mouth 3 (three) times daily. To start- Take 1 tab at bedtime for 3 days then increase to 1 tab at bedtime and 1 tab in the morning for three day. Then increase to 1 tab every 8 hours (3 times a day).  Dispense: 90 tablet; Refill: 3  3. GAD (generalized anxiety disorder) Well controlled- see [facility]- will check thyroid levels today.  - Thyroid Panel With TSH  4. Dysphasia Difficulty swallowing with feeling of lodging of food bolus with increased intensity since strep throat last year.  Plan to evaluate thyroid levels today and make changes to medication dosing if needed.  Will evaluate for further interventions that may help reduce this and notify the patient through mychart. Did discuss the possibility of referral to specialist for evaluation if conservative measures and control over thyroid are not successful.  Plan to check in in 2-3  weeks to see how propranolol is helping. Will re-evaluate for visit if needed at that time.    Follow-up: Return in about 6 months (around 10/18/2020) for When blood work is needed. Tollie Eth, NP

## 2020-04-18 ENCOUNTER — Other Ambulatory Visit: Payer: Self-pay | Admitting: Nurse Practitioner

## 2020-04-18 ENCOUNTER — Other Ambulatory Visit: Payer: Self-pay | Admitting: Neurology

## 2020-04-18 DIAGNOSIS — E039 Hypothyroidism, unspecified: Secondary | ICD-10-CM

## 2020-04-18 MED ORDER — LEVOTHYROXINE SODIUM 125 MCG PO TABS: ORAL_TABLET | ORAL | 3 refills | Status: DC

## 2020-04-18 NOTE — Progress Notes (Signed)
Thyroid levels elevated Increase levothyroxine 1.5 tab (187.27mcg) Saturday and Sunday weekly Monday through Friday 1 tab (125 mcg).  Recheck labs in 6-8 weeks.

## 2020-05-11 LAB — HM PAP SMEAR: HM Pap smear: NEGATIVE

## 2020-06-19 ENCOUNTER — Emergency Department
Admission: RE | Admit: 2020-06-19 | Discharge: 2020-06-19 | Disposition: A | Discharge status: Home / Self Care | Payer: BC Managed Care – PPO | Source: Ambulatory Visit

## 2020-06-19 ENCOUNTER — Other Ambulatory Visit: Payer: Self-pay

## 2020-06-19 VITALS — HR 98 | Temp 98.9°F | Ht 63.0 in | Wt 200.0 lb

## 2020-06-19 DIAGNOSIS — R059 Cough, unspecified: Secondary | ICD-10-CM | POA: Diagnosis not present

## 2020-06-19 DIAGNOSIS — R0982 Postnasal drip: Secondary | ICD-10-CM

## 2020-06-19 MED ORDER — FLUTICASONE PROPIONATE 50 MCG/ACT NA SUSP
2.0000 | Freq: Every day | NASAL | 12 refills | Status: DC
Start: 2020-06-19 — End: 2021-05-04

## 2020-06-19 MED ORDER — BENZONATATE 100 MG PO CAPS: 100.0000 mg | ORAL_CAPSULE | Freq: Three times a day (TID) | ORAL | 0 refills | Status: DC | PRN

## 2020-06-19 NOTE — ED Triage Notes (Addendum)
Cough since yesterday Vaccinated

## 2020-06-19 NOTE — ED Provider Notes (Signed)
[facility]    CSN: [id number] Arrival date & time: 06/19/20  1134      History   Chief Complaint Chief Complaint  Patient presents with  . Cough    HPI Lydia Gregory is a 29 y.o. female.   HPI  Cough x 1 day with chest tightness. No other worrisome symptoms. Patient endorse occasional rhinitis symptoms due to chronic allergies.  Patient has not taken any medication for symptoms.  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  She is not Spears any wheezing, shortness of breath or any other concerning symptoms.  Past Medical History:  Diagnosis Date  . Depressed     Patient Active Problem List   Diagnosis Date Noted  . Back pain 01/12/2020  . Sinus tachycardia 05/25/2019  . Transaminitis 12/31/2017  . No energy 01/02/2017  . Hypothyroidism 01/02/2017  . Inattention 01/02/2017  . Attention deficit hyperactivity disorder (ADHD), predominantly inattentive type 01/02/2017  . Class 1 obesity due to excess calories without serious comorbidity with body mass index (BMI) of 33.0 to 33.9 in adult 08/20/2016  . Vitamin D deficiency 08/20/2016  . MDD (major depressive disorder), recurrent episode, moderate (HCC) 08/20/2016  . GAD (generalized anxiety disorder) 08/20/2016    Past Surgical History:  Procedure Laterality Date  . KIDNEY STONE SURGERY    . WISDOM TOOTH EXTRACTION      OB History   No obstetric history on file.      Home Medications    Prior to Admission medications   Medication Sig Start Date End Date Taking? Authorizing Provider  ALPRAZolam (XANAX) 0.5 MG tablet TAKE 1 TABLET BY MOUTH TWICE A DAY AS NEEDED FOR ANXIETY 10/12/18   Breeback, Jade L, PA-C  busPIRone (BUSPAR) 10 MG tablet TAKE 0.5 TABLETS (5 MG TOTAL) BY MOUTH 2 (TWO) TIMES DAILY. DUE FOR APPT, PLEASE CALL TO SCHEDULE VIRTUAL VISIT 01/08/19   Jomarie Longs, PA-C  etonogestrel (NEXPLANON) 68 MG IMPL implant  Inserted 12/31/17 Lot# [id number] 12/31/17   Sunnie Nielsen, DO  fexofenadine (ALLEGRA) 180 MG tablet Take 180 mg by mouth daily.    [provider]  FLUoxetine (PROZAC) 20 MG capsule Take 20 mg by mouth every morning. 01/05/20   [provider]  lamoTRIgine (LAMICTAL) 150 MG tablet Take 150 mg by mouth daily. 12/16/19   [provider]  levothyroxine (SYNTHROID) 125 MCG tablet 1 tab (125 mcg) by mouth Monday through Friday. 1.5 tab (187.5 mcg) by mouth Saturday and Sunday. Take on empty stomach at least 30 minutes before eating or any there medications. 04/18/20   Tollie Eth, NP  Probiotic Product (PROBIOTIC-10 PO) Take by mouth daily.    [provider]  propranolol (INDERAL) 10 MG tablet Take 1 tablet (10 mg total) by mouth 3 (three) times daily. To start- Take 1 tab at bedtime for 3 days then increase to 1 tab at bedtime and 1 tab in the morning for three day. Then increase to 1 tab every 8 hours (3 times a day). 04/17/20   Tollie Eth, NP  spironolactone (ALDACTONE) 50 MG tablet Take 150 mg by mouth daily.     [provider]  VYVANSE 40 MG capsule Take 40 mg by mouth every morning. 02/14/20   [provider]    Family History Family History  Problem Relation Age of Onset  . Diabetes Mother   . Hypertension Mother   .  Hypertension Father     Social History Social History   Tobacco Use  . Smoking status: Never Smoker  . Smokeless tobacco: Never Used  Vaping Use  . Vaping Use: Never used  Substance Use Topics  . Alcohol use: Yes    Alcohol/week: 3.0 standard drinks    Types: 3 Glasses of wine per week  . Drug use: No     Allergies   Amoxicillin, Penicillins, and Septra [sulfamethoxazole-trimethoprim]   Review of Systems Review of Systems Pertinent negatives listed in HPI  Physical Exam Triage Vital Signs ED Triage Vitals  Enc Vitals Group     BP --      Pulse Rate 06/19/20 1159 98     Resp --      Temp 06/19/20  1159 98.9 F (37.2 C)     Temp Source 06/19/20 1159 Oral     SpO2 --      Weight 06/19/20 1200 200 lb (90.7 kg)     Height 06/19/20 1200 5\' 3"  (1.6 m)     Head Circumference --      Peak Flow --      Pain Score 06/19/20 1200 0     Pain Loc --      Pain Edu? --      Excl. in GC? --    No data found.  Updated Vital Signs Pulse 98   Temp 98.9 F (37.2 C) (Oral)   Ht 5\' 3"  (1.6 m)   Wt 200 lb (90.7 kg)   BMI 35.43 kg/m   Visual Acuity Right Eye Distance:   Left Eye Distance:   Bilateral Distance:    Right Eye Near:   Left Eye Near:    Bilateral Near:     Physical Exam HENT:     Head: Normocephalic.     Nose: Congestion and rhinorrhea present.     Mouth/Throat:     Mouth: Mucous membranes are moist.     Pharynx: Posterior oropharyngeal erythema present. No pharyngeal swelling, oropharyngeal exudate or uvula swelling.  Cardiovascular:     Rate and Rhythm: Normal rate and regular rhythm.  Pulmonary:     Effort: Pulmonary effort is normal.     Breath sounds: Normal breath sounds and air entry.  Skin:    General: Skin is warm.     Capillary Refill: Capillary refill takes less than 2 seconds.  Psychiatric:        Attention and Perception: Attention normal.        Mood and Affect: Mood normal.      UC Treatments / Results  Labs (all labs ordered are listed, but only abnormal results are displayed) Labs Reviewed - No data to display  EKG   Radiology No results found.  Procedures Procedures (including critical care time)  Medications Ordered in UC Medications - No data to display  Initial Impression / Assessment and Plan / UC Course  I have reviewed the triage vital signs and the nursing notes.  Pertinent labs & imaging results that were available during my care of the patient were reviewed by me and considered in my medical decision making (see chart for details).     Given course of symptoms present for only approximately 24 hours treatment is a  viral illness and will treat symptoms only.  Benzonatate prescribed for cough and Flonase for nasal congestion which is causing postnasal drip. If symptoms do not improve or if they worsen I would recommend Covid testing at that point. Work  note provided. SymptomsFinal Clinical Impressions(s) / UC Diagnoses   Final diagnoses:  Cough  Postnasal drip   Discharge Instructions   None    ED Prescriptions    Medication Sig Dispense Auth. Provider   benzonatate (TESSALON) 100 MG capsule Take 1-2 capsules (100-200 mg total) by mouth 3 (three) times daily as needed for cough. 40 capsule Bing Neighbors, FNP   fluticasone (FLONASE) 50 MCG/ACT nasal spray Place 2 sprays into both nostrils daily. 16 g Bing Neighbors, FNP     PDMP not reviewed this encounter.   Bing Neighbors, FNP 06/21/20 1438

## 2020-07-10 ENCOUNTER — Other Ambulatory Visit: Payer: Self-pay | Admitting: Nurse Practitioner

## 2020-07-10 DIAGNOSIS — E039 Hypothyroidism, unspecified: Secondary | ICD-10-CM

## 2020-07-10 DIAGNOSIS — R000 Tachycardia, unspecified: Secondary | ICD-10-CM

## 2020-07-22 ENCOUNTER — Encounter: Payer: Self-pay | Admitting: Physician Assistant

## 2020-07-23 ENCOUNTER — Other Ambulatory Visit: Payer: Self-pay

## 2020-07-23 ENCOUNTER — Emergency Department (INDEPENDENT_AMBULATORY_CARE_PROVIDER_SITE_OTHER)
Admission: RE | Admit: 2020-07-23 | Discharge: 2020-07-23 | Disposition: A | Discharge status: Home / Self Care | Payer: BC Managed Care – PPO | Source: Ambulatory Visit

## 2020-07-23 VITALS — BP 119/86 | HR 109 | Temp 99.2°F | Resp 16 | Ht 64.0 in | Wt 200.0 lb

## 2020-07-23 DIAGNOSIS — B029 Zoster without complications: Secondary | ICD-10-CM

## 2020-07-23 HISTORY — DX: Depression, unspecified: F32.A

## 2020-07-23 HISTORY — DX: Disorder of thyroid, unspecified: E07.9

## 2020-07-23 HISTORY — DX: Other specified behavioral and emotional disorders with onset usually occurring in childhood and adolescence: F98.8

## 2020-07-23 HISTORY — DX: Disorder of kidney and ureter, unspecified: N28.9

## 2020-07-23 HISTORY — DX: Anxiety disorder, unspecified: F41.9

## 2020-07-23 MED ORDER — VALACYCLOVIR HCL 1 G PO TABS: 1000.0000 mg | ORAL_TABLET | Freq: Three times a day (TID) | ORAL | 0 refills | Status: AC

## 2020-07-23 NOTE — Discharge Instructions (Signed)
  Please take your valacyclovir as prescirbed to help limit severity and duration of symptoms. You may try calamine lotion and/or oatmeal baths to help with skin irritation.  You may take 500mg  acetaminophen every 4-6 hours or in combination with ibuprofen 400-600mg  every 6-8 hours as needed for pain, inflammation, and fever.  Be sure to well hydrated with clear liquids and get at least 8 hours of sleep at night, preferably more while sick.   Please follow up with family medicine in 1 week if needed.

## 2020-07-23 NOTE — ED Triage Notes (Signed)
Patient here for day 3 of rash on right thigh; no new or different possible allergens; states it burns and hurts rather than itches. Has had covid vaccinations.

## 2020-07-23 NOTE — ED Provider Notes (Signed)
[facility]    CSN: [id number] Arrival date & time: 07/23/20  1046      History   Chief Complaint Chief Complaint  Patient presents with  . Rash    HPI Lydia Gregory is a 29 y.o. female.   HPI  Lydia Gregory is a 29 y.o. female presenting to UC with c/o rash on Right thigh that started about 4 days ago.  Rash is burning and sore rather than itching. She has taken benadryl and used cortisone cream without relief. Denies fever, chills, n/v/d. No other rashes. No new soaps, lotions or medications. She has received the varicella vaccines as a child.   Renal disorder noted in PMH but normal CrCl as of 05/25/2019.   Past Medical History:  Diagnosis Date  . ADD (attention deficit disorder)   . Anxiety and depression   . Depressed   . Renal disorder   . Thyroid disease     Patient Active Problem List   Diagnosis Date Noted  . Back pain 01/12/2020  . Sinus tachycardia 05/25/2019  . Transaminitis 12/31/2017  . No energy 01/02/2017  . Hypothyroidism 01/02/2017  . Inattention 01/02/2017  . Attention deficit hyperactivity disorder (ADHD), predominantly inattentive type 01/02/2017  . Class 1 obesity due to excess calories without serious comorbidity with body mass index (BMI) of 33.0 to 33.9 in adult 08/20/2016  . Vitamin D deficiency 08/20/2016  . MDD (major depressive disorder), recurrent episode, moderate (HCC) 08/20/2016  . GAD (generalized anxiety disorder) 08/20/2016    Past Surgical History:  Procedure Laterality Date  . CYSTOSCOPY KIDNEY W/ URETERAL GUIDE WIRE    . KIDNEY STONE SURGERY    . WISDOM TOOTH EXTRACTION      OB History   No obstetric history on file.      Home Medications    Prior to Admission medications   Medication Sig Start Date End Date Taking? Authorizing Provider  ALPRAZolam (XANAX) 0.5 MG tablet TAKE 1 TABLET BY MOUTH TWICE A DAY AS NEEDED FOR ANXIETY 10/12/18   Breeback, Jade L, PA-C  benzonatate (TESSALON) 100 MG capsule  Take 1-2 capsules (100-200 mg total) by mouth 3 (three) times daily as needed for cough. 06/19/20   Bing Neighbors, FNP  busPIRone (BUSPAR) 10 MG tablet TAKE 0.5 TABLETS (5 MG TOTAL) BY MOUTH 2 (TWO) TIMES DAILY. DUE FOR APPT, PLEASE CALL TO SCHEDULE VIRTUAL VISIT 01/08/19   Jomarie Longs, PA-C  etonogestrel (NEXPLANON) 68 MG IMPL implant Inserted 12/31/17 Lot# [id number] 12/31/17   Sunnie Nielsen, DO  fexofenadine (ALLEGRA) 180 MG tablet Take 180 mg by mouth daily.    [provider]  FLUoxetine (PROZAC) 20 MG capsule Take 20 mg by mouth every morning. 01/05/20   [provider]  fluticasone (FLONASE) 50 MCG/ACT nasal spray Place 2 sprays into both nostrils daily. 06/19/20   Bing Neighbors, FNP  lamoTRIgine (LAMICTAL) 150 MG tablet Take 150 mg by mouth daily. 12/16/19   [provider]  levothyroxine (SYNTHROID) 125 MCG tablet 1 tab (125 mcg) by mouth Monday through Friday. 1.5 tab (187.5 mcg) by mouth Saturday and Sunday. Take on empty stomach at least 30 minutes before eating or any there medications. 04/18/20   Tollie Eth, NP  Probiotic Product (PROBIOTIC-10 PO) Take by mouth daily.    [provider]  propranolol (INDERAL) 10 MG tablet Take 1 tablet (10 mg total) by mouth 3 (three) times daily. 07/10/20   Tollie Eth, NP  spironolactone (ALDACTONE) 50  MG tablet Take 150 mg by mouth daily.     [provider]  valACYclovir (VALTREX) 1000 MG tablet Take 1 tablet (1,000 mg total) by mouth 3 (three) times daily for 7 days. 07/23/20 07/30/20  Lurene Shadow, PA-C  VYVANSE 40 MG capsule Take 60 mg by mouth every morning.  02/14/20   [provider]    Family History Family History  Problem Relation Age of Onset  . Diabetes Mother   . Hypertension Mother   . Hypertension Father     Social History Social History   Tobacco Use  . Smoking status: Never Smoker  . Smokeless tobacco: Never Used  Vaping Use  . Vaping Use: Never used    Substance Use Topics  . Alcohol use: Yes    Alcohol/week: 3.0 standard drinks    Types: 3 Glasses of wine per week  . Drug use: No     Allergies   Amoxicillin, Penicillins, and Septra [sulfamethoxazole-trimethoprim]   Review of Systems Review of Systems  Constitutional: Negative for chills and fever.  Gastrointestinal: Negative for diarrhea, nausea and vomiting.  Musculoskeletal: Negative for arthralgias, gait problem and joint swelling.  Skin: Positive for rash. Negative for wound.     Physical Exam Triage Vital Signs ED Triage Vitals  Enc Vitals Group     BP 07/23/20 1109 119/86     Pulse Rate 07/23/20 1109 (!) 109     Resp 07/23/20 1109 16     Temp 07/23/20 1109 99.2 F (37.3 C)     Temp src --      SpO2 07/23/20 1109 99 %     Weight 07/23/20 1110 200 lb (90.7 kg)     Height 07/23/20 1110 5\' 4"  (1.626 m)     Head Circumference --      Peak Flow --      Pain Score 07/23/20 1109 4     Pain Loc --      Pain Edu? --      Excl. in GC? --    No data found.  Updated Vital Signs BP 119/86 (BP Location: Right Arm)   Pulse (!) 109   Temp 99.2 F (37.3 C)   Resp 16   Ht 5\' 4"  (1.626 m)   Wt 200 lb (90.7 kg)   SpO2 99%   BMI 34.33 kg/m   Visual Acuity Right Eye Distance:   Left Eye Distance:   Bilateral Distance:    Right Eye Near:   Left Eye Near:    Bilateral Near:     Physical Exam Vitals and nursing note reviewed.  Constitutional:      Appearance: Normal appearance. She is well-developed.  HENT:     Head: Normocephalic and atraumatic.  Cardiovascular:     Rate and Rhythm: Tachycardia present.  Pulmonary:     Effort: Pulmonary effort is normal.  Musculoskeletal:        General: Normal range of motion.     Cervical back: Normal range of motion.  Skin:    General: Skin is warm and dry.     Findings: Erythema and rash present.       Neurological:     Mental Status: She is alert and oriented to person, place, and time.  Psychiatric:         Behavior: Behavior normal.      UC Treatments / Results  Labs (all labs ordered are listed, but only abnormal results are displayed) Labs Reviewed - No data to  display  EKG   Radiology No results found.  Procedures Procedures (including critical care time)  Medications Ordered in UC Medications - No data to display  Initial Impression / Assessment and Plan / UC Course  I have reviewed the triage vital signs and the nursing notes.  Pertinent labs & imaging results that were available during my care of the patient were reviewed by me and considered in my medical decision making (see chart for details).     Rash c/w herpes zoster without complication Rx: valtrex  Pt has renal disorder listed on her PMH but has normal CrCl within the last year. Encouraged f/u with PCP later this week AVS given  Final Clinical Impressions(s) / UC Diagnoses   Final diagnoses:  Herpes zoster without complication     Discharge Instructions      Please take your valacyclovir as prescirbed to help limit severity and duration of symptoms. You may try calamine lotion and/or oatmeal baths to help with skin irritation.  You may take 500mg  acetaminophen every 4-6 hours or in combination with ibuprofen 400-600mg  every 6-8 hours as needed for pain, inflammation, and fever.  Be sure to well hydrated with clear liquids and get at least 8 hours of sleep at night, preferably more while sick.   Please follow up with family medicine in 1 week if needed.     ED Prescriptions    Medication Sig Dispense Auth. Provider   valACYclovir (VALTREX) 1000 MG tablet Take 1 tablet (1,000 mg total) by mouth 3 (three) times daily for 7 days. 21 tablet , Lurene Shadow     PDMP not reviewed this encounter.   [place], PA-C 07/23/20 1227

## 2020-07-24 MED ORDER — TRAMADOL HCL 50 MG PO TABS: 50.0000 mg | ORAL_TABLET | Freq: Four times a day (QID) | ORAL | 0 refills | Status: AC | PRN

## 2020-08-01 DIAGNOSIS — N6489 Other specified disorders of breast: Secondary | ICD-10-CM | POA: Insufficient documentation

## 2020-08-11 ENCOUNTER — Other Ambulatory Visit: Payer: Self-pay | Admitting: Nurse Practitioner

## 2020-08-11 DIAGNOSIS — E039 Hypothyroidism, unspecified: Secondary | ICD-10-CM

## 2020-09-16 ENCOUNTER — Other Ambulatory Visit: Payer: Self-pay | Admitting: Physician Assistant

## 2020-09-16 DIAGNOSIS — E039 Hypothyroidism, unspecified: Secondary | ICD-10-CM

## 2020-10-16 ENCOUNTER — Other Ambulatory Visit: Payer: Self-pay | Admitting: Physician Assistant

## 2020-10-16 DIAGNOSIS — E039 Hypothyroidism, unspecified: Secondary | ICD-10-CM

## 2020-10-18 ENCOUNTER — Ambulatory Visit (INDEPENDENT_AMBULATORY_CARE_PROVIDER_SITE_OTHER): Payer: BC Managed Care – PPO | Admitting: Physician Assistant

## 2020-10-18 DIAGNOSIS — Z5329 Procedure and treatment not carried out because of patient's decision for other reasons: Secondary | ICD-10-CM

## 2020-10-18 NOTE — Progress Notes (Signed)
No show

## 2020-11-29 ENCOUNTER — Encounter: Payer: Self-pay | Admitting: Physician Assistant

## 2020-12-05 ENCOUNTER — Ambulatory Visit (INDEPENDENT_AMBULATORY_CARE_PROVIDER_SITE_OTHER): Payer: BC Managed Care – PPO | Admitting: Physician Assistant

## 2020-12-05 ENCOUNTER — Other Ambulatory Visit: Payer: Self-pay

## 2020-12-05 ENCOUNTER — Encounter: Payer: Self-pay | Admitting: Physician Assistant

## 2020-12-05 VITALS — BP 116/74 | HR 89 | Ht 64.0 in | Wt 197.0 lb

## 2020-12-05 DIAGNOSIS — Z01818 Encounter for other preprocedural examination: Secondary | ICD-10-CM

## 2020-12-05 DIAGNOSIS — N62 Hypertrophy of breast: Secondary | ICD-10-CM

## 2020-12-05 NOTE — Progress Notes (Signed)
   Subjective:    Patient ID: Lydia Gregory, female    DOB: 1991/08/09, 30 y.o.   MRN: [id number]  HPI  Pt is a 30 yo female who presents to the clinic for pre-surgical clearance for breast reduction.   She is a cupsize H right now going to a C. Dr. Drue Second is doing the surgery. Surgery planned for April 7th.   .. Active Ambulatory Problems    Diagnosis Date Noted  . Class 1 obesity due to excess calories without serious comorbidity with body mass index (BMI) of 33.0 to 33.9 in adult 08/20/2016  . Vitamin D deficiency 08/20/2016  . MDD (major depressive disorder), recurrent episode, moderate (HCC) 08/20/2016  . GAD (generalized anxiety disorder) 08/20/2016  . No energy 01/02/2017  . Hypothyroidism 01/02/2017  . Inattention 01/02/2017  . Attention deficit hyperactivity disorder (ADHD), predominantly inattentive type 01/02/2017  . Transaminitis 12/31/2017  . Sinus tachycardia 05/25/2019  . Back pain 01/12/2020  . Pre-op evaluation 12/05/2020   Resolved Ambulatory Problems    Diagnosis Date Noted  . No Resolved Ambulatory Problems   Past Medical History:  Diagnosis Date  . ADD (attention deficit disorder)   . Anxiety and depression   . Depressed   . Renal disorder   . Thyroid disease     Review of Systems  All other systems reviewed and are negative.      Objective:   Physical Exam Vitals reviewed.  Constitutional:      Appearance: Normal appearance. She is obese.  HENT:     Head: Normocephalic.  Neck:     Vascular: No carotid bruit.  Cardiovascular:     Rate and Rhythm: Normal rate and regular rhythm.     Pulses: Normal pulses.     Heart sounds: Normal heart sounds.  Pulmonary:     Effort: Pulmonary effort is normal.     Breath sounds: Normal breath sounds.  Musculoskeletal:     Right lower leg: No edema.     Left lower leg: No edema.  Lymphadenopathy:     Cervical: No cervical adenopathy.  Neurological:     General: No focal deficit present.     Mental  Status: She is alert and oriented to person, place, and time.  Psychiatric:        Mood and Affect: Mood normal.           Assessment & Plan:  Marland KitchenMarland KitchenLogan was seen today for pre-op exam.  Diagnoses and all orders for this visit:  Pre-op evaluation -     COMPLETE METABOLIC PANEL WITH GFR -     CBC with Differential/Platelet -     TSH -     APTT  Breast hypertrophy in female   EKG review:  NSR, no arrhthymias, no ST elevation or depression.  Vitals great.  Labs ordered.  Implant-nexplanon for birth control.  Pending labs cleared for surgery.

## 2020-12-06 LAB — CBC WITH DIFFERENTIAL/PLATELET
Absolute Monocytes: 825 cells/uL (ref 200–950)
Basophils Absolute: 43 cells/uL (ref 0–200)
Basophils Relative: 0.5 %
Eosinophils Absolute: 60 cells/uL (ref 15–500)
Eosinophils Relative: 0.7 %
HCT: 43.1 % (ref 35.0–45.0)
Hemoglobin: 14.5 g/dL (ref 11.7–15.5)
Lymphs Abs: 2295 cells/uL (ref 850–3900)
MCH: 30.1 pg (ref 27.0–33.0)
MCHC: 33.6 g/dL (ref 32.0–36.0)
MCV: 89.6 fL (ref 80.0–100.0)
MPV: 9.9 fL (ref 7.5–12.5)
Monocytes Relative: 9.7 %
Neutro Abs: 5279 cells/uL (ref 1500–7800)
Neutrophils Relative %: 62.1 %
Platelets: 313 10*3/uL (ref 140–400)
RBC: 4.81 10*6/uL (ref 3.80–5.10)
RDW: 11.7 % (ref 11.0–15.0)
Total Lymphocyte: 27 %
WBC: 8.5 10*3/uL (ref 3.8–10.8)

## 2020-12-06 LAB — COMPLETE METABOLIC PANEL WITH GFR
AG Ratio: 2 (calc) (ref 1.0–2.5)
ALT: 27 U/L (ref 6–29)
AST: 20 U/L (ref 10–30)
Albumin: 4.9 g/dL (ref 3.6–5.1)
Alkaline phosphatase (APISO): 98 U/L (ref 31–125)
BUN: 12 mg/dL (ref 7–25)
CO2: 21 mmol/L (ref 20–32)
Calcium: 10.5 mg/dL — ABNORMAL HIGH (ref 8.6–10.2)
Chloride: 103 mmol/L (ref 98–110)
Creat: 1.03 mg/dL (ref 0.50–1.10)
GFR, Est African American: 86 mL/min/{1.73_m2} (ref >=60)
GFR, Est Non African American: 74 mL/min/{1.73_m2} (ref >=60)
Globulin: 2.5 g/dL (calc) (ref 1.9–3.7)
Glucose, Bld: 83 mg/dL (ref 65–99)
Potassium: 4.6 mmol/L (ref 3.5–5.3)
Sodium: 139 mmol/L (ref 135–146)
Total Bilirubin: 0.9 mg/dL (ref 0.2–1.2)
Total Protein: 7.4 g/dL (ref 6.1–8.1)

## 2020-12-06 LAB — TSH: TSH: 2.08 mIU/L

## 2020-12-06 LAB — APTT: aPTT: 31 s (ref 23–32)

## 2020-12-06 NOTE — Progress Notes (Signed)
Cleared for surgery. Please send labs/ekg/note/letter for clearance.

## 2020-12-06 NOTE — Progress Notes (Signed)
Lydia Gregory,   Thyroid perfect.  Kidney, liver, glucose look great.  Calcium just a hair up.  Make sure taking vitamin D at least 1000 units a day.   Waiting for CBC and APTT to result.

## 2020-12-07 NOTE — Addendum Note (Signed)
Addended by: Chalmers Cater on: 12/07/2020 01:13 PM   Modules accepted: Orders

## 2020-12-25 ENCOUNTER — Other Ambulatory Visit: Payer: Self-pay | Admitting: Physician Assistant

## 2020-12-25 DIAGNOSIS — E039 Hypothyroidism, unspecified: Secondary | ICD-10-CM

## 2021-01-12 ENCOUNTER — Other Ambulatory Visit: Payer: Self-pay | Admitting: Nurse Practitioner

## 2021-01-12 DIAGNOSIS — R000 Tachycardia, unspecified: Secondary | ICD-10-CM

## 2021-01-12 DIAGNOSIS — E039 Hypothyroidism, unspecified: Secondary | ICD-10-CM

## 2021-05-04 ENCOUNTER — Other Ambulatory Visit: Payer: Self-pay

## 2021-05-04 ENCOUNTER — Ambulatory Visit: Payer: BC Managed Care – PPO | Admitting: Physician Assistant

## 2021-05-04 VITALS — BP 129/83 | HR 108 | Ht 64.0 in | Wt 220.0 lb

## 2021-05-04 DIAGNOSIS — R000 Tachycardia, unspecified: Secondary | ICD-10-CM | POA: Diagnosis not present

## 2021-05-04 DIAGNOSIS — F411 Generalized anxiety disorder: Secondary | ICD-10-CM

## 2021-05-04 DIAGNOSIS — R635 Abnormal weight gain: Secondary | ICD-10-CM

## 2021-05-04 DIAGNOSIS — Z13220 Encounter for screening for lipoid disorders: Secondary | ICD-10-CM

## 2021-05-04 DIAGNOSIS — F900 Attention-deficit hyperactivity disorder, predominantly inattentive type: Secondary | ICD-10-CM

## 2021-05-04 DIAGNOSIS — E6609 Other obesity due to excess calories: Secondary | ICD-10-CM | POA: Diagnosis not present

## 2021-05-04 DIAGNOSIS — E039 Hypothyroidism, unspecified: Secondary | ICD-10-CM

## 2021-05-04 DIAGNOSIS — Z23 Encounter for immunization: Secondary | ICD-10-CM | POA: Diagnosis not present

## 2021-05-04 DIAGNOSIS — Z6837 Body mass index (BMI) 37.0-37.9, adult: Secondary | ICD-10-CM

## 2021-05-04 DIAGNOSIS — F331 Major depressive disorder, recurrent, moderate: Secondary | ICD-10-CM

## 2021-05-04 NOTE — Patient Instructions (Signed)
Vitamin D at least 1000 units a day.

## 2021-05-04 NOTE — Progress Notes (Signed)
Subjective:    Patient ID: Lydia Gregory, female    DOB: 05/29/1991, 30 y.o.   MRN: [id number]  HPI Patient is a 30 year old obese female with ADHD, MDD, anxiety, hypothyroidism who presents to the clinic for medication management and to discuss weight.  She is very concerned because since May she has gained 30 pounds.  She does not notice that she has been eating much differently.  She has not added any new medications.  She does have Nexplanon and is not having periods.  She has had Nexplanon for the last 5 years.  She has worked on cutting back her portions.  She admits she is not exercising.  Weight gain has really taken a toll on her mood.  She feels much more down and defeated.  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  She really feels like something could be wrong and wants everything to be evaluated.  She is sleeping well at night.  She does snore.  She does not always feel very rested in the morning.  .. Active Ambulatory Problems    Diagnosis Date Noted   Class 2 obesity due to excess calories without serious comorbidity with body mass index (BMI) of 37.0 to 37.9 in adult 08/20/2016   Vitamin D deficiency 08/20/2016   MDD (major depressive disorder), recurrent episode, moderate (HCC) 08/20/2016   GAD (generalized anxiety disorder) 08/20/2016   No energy 01/02/2017   Hypothyroidism 01/02/2017   Inattention 01/02/2017   Attention deficit hyperactivity disorder (ADHD), predominantly inattentive type 01/02/2017   Transaminitis 12/31/2017   Sinus tachycardia 05/25/2019   Back pain 01/12/2020   Pre-op evaluation 12/05/2020   Breast hypertrophy in female 12/05/2020   Abnormal weight gain 05/04/2021   Resolved Ambulatory Problems    Diagnosis Date Noted   No Resolved Ambulatory Problems   Past Medical History:  Diagnosis Date   ADD (attention deficit disorder)    Anxiety and depression    Depressed    Renal disorder     Thyroid disease       Review of Systems See HPI.     Objective:   Physical Exam Vitals reviewed.  Constitutional:      Appearance: Normal appearance. She is obese.  HENT:     Head: Normocephalic.  Neck:     Vascular: No carotid bruit.  Cardiovascular:     Rate and Rhythm: Regular rhythm. Tachycardia present.     Pulses: Normal pulses.     Heart sounds: Normal heart sounds.  Pulmonary:     Effort: Pulmonary effort is normal.     Breath sounds: Normal breath sounds.  Musculoskeletal:     Cervical back: Normal range of motion and neck supple.     Right lower leg: No edema.     Left lower leg: No edema.  Lymphadenopathy:     Cervical: No cervical adenopathy.  Neurological:     General: No focal deficit present.     Mental Status: She is alert and oriented to person, place, and time.  Psychiatric:        Mood and Affect: Mood normal.      .. Depression screen [facility] 2/9 05/04/2021 05/25/2019 09/18/2018 02/25/2018 01/21/2018  Decreased Interest 3 0 0 2 1  Down, Depressed, Hopeless 0 0 0 2 2  PHQ - 2 Score 3 0 0 4 3  Altered sleeping 3 1 1 2 1   Tired, decreased energy 3 2 1  3  2  Change in appetite 1 1 0 2 0  Feeling bad or failure about yourself  0 0 1 2 1   Trouble concentrating 2 2 1 2 2   Moving slowly or fidgety/restless 0 0 0 1 0  Suicidal thoughts 0 0 0 1 1  PHQ-9 Score 12 6 4 17 10   Difficult doing work/chores Somewhat difficult Not difficult at all Not difficult at all Very difficult Very difficult  Some recent data might be hidden   . GAD 7 : Generalized Anxiety Score 05/04/2021 05/25/2019 09/18/2018 02/25/2018  Nervous, Anxious, on Edge 1 2 1 2   Control/stop worrying 1 0 0 1  Worry too much - different things 1 1 0 2  Trouble relaxing 2 0 0 2  Restless 2 1 0 1  Easily annoyed or irritable 1 2 0 3  Afraid - awful might happen 0 0 0 0  Total GAD 7 Score 8 6 1 11   Anxiety Difficulty Somewhat difficult Not difficult at all Not difficult at all Somewhat difficult         Assessment & Plan:  9/17/20203/10/2020[name] was seen today for follow-up.  Diagnoses and all orders for this visit:  Abnormal weight gain -     Hemoglobin A1c -     COMPLETE METABOLIC PANEL WITH GFR -     CP Testosterone, BIO-Female/Children -     Insulin, random -     Cortisol, free, Serum -     ACTH -     Lipid Panel w/reflex Direct LDL -     DHEA-sulfate  Flu vaccine need -     Flu Vaccine QUAD 68mo+IM (Fluarix, Fluzone & Alfiuria Quad PF)  Class 2 obesity due to excess calories without serious comorbidity with body mass index (BMI) of 37.0 to 37.9 in adult  Acquired hypothyroidism -     Thyroid Panel With TSH  MDD (major depressive disorder), recurrent episode, moderate (HCC)  Screening for lipid disorders -     Lipid Panel w/reflex Direct LDL  Sinus tachycardia  Attention deficit hyperactivity disorder (ADHD), predominantly inattentive type  GAD (generalized anxiety disorder)  Unclear where weight gain is coming from.  Mood seems to be worsening due to weight gain.  Start with panel of labs.  Pt has appt next week with mood treatment center to adjust medications for mood/focus.  Medications reviewed and not on any medication known for excessive weight gain.  Labs do need to be fasting.  Make sure exercising a week.  Consider weight watchers or tracking calories to 1300 a day.  Consider sleep study.   Spent 30 minutes with patient discussed weight and plan for management.

## 2021-05-07 ENCOUNTER — Encounter: Payer: Self-pay | Admitting: Physician Assistant

## 2021-06-16 ENCOUNTER — Encounter: Payer: Self-pay | Admitting: Physician Assistant

## 2021-06-18 ENCOUNTER — Encounter: Payer: Self-pay | Admitting: Family Medicine

## 2021-06-18 ENCOUNTER — Telehealth (INDEPENDENT_AMBULATORY_CARE_PROVIDER_SITE_OTHER): Payer: BC Managed Care – PPO | Admitting: Family Medicine

## 2021-06-18 VITALS — Temp 99.4°F

## 2021-06-18 DIAGNOSIS — U071 COVID-19: Secondary | ICD-10-CM | POA: Diagnosis not present

## 2021-06-18 MED ORDER — BENZONATATE 200 MG PO CAPS: 200.0000 mg | ORAL_CAPSULE | Freq: Three times a day (TID) | ORAL | 0 refills | Status: DC | PRN

## 2021-06-18 NOTE — Progress Notes (Signed)
Virtual Visit via Video Note  I connected with Lydia Gregory on 06/18/21 at  3:00 PM EDT by a video enabled telemedicine application and verified that I am speaking with the correct person using two identifiers.   I discussed the limitations of evaluation and management by telemedicine and the availability of in person appointments. The patient expressed understanding and agreed to proceed.  Patient location: at home Provider location: in office  Subjective:    CC: COVID 19   HPI: Started feeling bad about 5 days ago.  Throat pain and body aches are better. Getting SOB with going up and down stairs.  Not as bad today. Productive cough.  No diarrhea.  . + fever and now low grade. No chest pressure. Not using an inhaler. Sleeping well. She is a Engineer, site.  He has had pneumonia and bronchitis before and says it does not feel like that.   Past medical history, Surgical history, Family history not pertinant except as noted below, Social history, Allergies, and medications have been entered into the medical record, reviewed, and corrections made.    Objective:    General: Speaking clearly in complete sentences without any shortness of breath.  Alert and oriented x3.  Normal judgment. No apparent acute distress.    Impression and Recommendations:    No problem-specific Assessment & Plan notes found for this encounter.  COVID 19 -recommend continue with symptomatic care.  I did go ahead and write her out of work through Wednesday.  Hopefully she will continue to improve.  Jerilynn Som sent to pharmacy.  If any problems or new symptoms develop please let me know if shortness of breath worsens or cough worsens then please let me know.   No orders of the defined types were placed in this encounter.   Meds ordered this encounter  Medications   benzonatate (TESSALON) 200 MG capsule    Sig: Take 1 capsule (200 mg total) by mouth 3 (three) times daily as needed for cough.     Dispense:  45 capsule    Refill:  0    I discussed the assessment and treatment plan with the patient. The patient was provided an opportunity to ask questions and all were answered. The patient agreed with the plan and demonstrated an understanding of the instructions.   The patient was advised to call back or seek an in-person evaluation if the symptoms worsen or if the condition fails to improve as anticipated.   Nani Gasser, MD

## 2021-06-18 NOTE — Progress Notes (Signed)
Pt tested on Saturday and received her results last night   Her sxs have been fever, it is now low grade,congestion, sore throat.

## 2021-06-19 ENCOUNTER — Encounter: Payer: Self-pay | Admitting: Family Medicine

## 2021-07-20 ENCOUNTER — Other Ambulatory Visit: Payer: Self-pay | Admitting: Physician Assistant

## 2021-07-20 DIAGNOSIS — E039 Hypothyroidism, unspecified: Secondary | ICD-10-CM

## 2021-07-20 DIAGNOSIS — R000 Tachycardia, unspecified: Secondary | ICD-10-CM

## 2021-07-31 ENCOUNTER — Telehealth: Payer: BC Managed Care – PPO | Admitting: Family

## 2021-07-31 ENCOUNTER — Encounter: Payer: Self-pay | Admitting: Physician Assistant

## 2021-07-31 DIAGNOSIS — R6889 Other general symptoms and signs: Secondary | ICD-10-CM | POA: Diagnosis not present

## 2021-07-31 MED ORDER — OSELTAMIVIR PHOSPHATE 75 MG PO CAPS: 75.0000 mg | ORAL_CAPSULE | Freq: Two times a day (BID) | ORAL | 0 refills | Status: DC

## 2021-07-31 NOTE — Progress Notes (Signed)

## 2021-08-04 ENCOUNTER — Encounter: Payer: Self-pay | Admitting: Emergency Medicine

## 2021-08-04 ENCOUNTER — Other Ambulatory Visit: Payer: Self-pay

## 2021-08-04 ENCOUNTER — Emergency Department
Admission: EM | Admit: 2021-08-04 | Discharge: 2021-08-04 | Disposition: A | Discharge status: Home / Self Care | Payer: BC Managed Care – PPO | Source: Home / Self Care | Attending: Family Medicine | Admitting: Family Medicine

## 2021-08-04 DIAGNOSIS — J111 Influenza due to unidentified influenza virus with other respiratory manifestations: Secondary | ICD-10-CM

## 2021-08-04 MED ORDER — AZITHROMYCIN 250 MG PO TABS: 250.0000 mg | ORAL_TABLET | Freq: Every day | ORAL | 0 refills | Status: DC

## 2021-08-04 MED ORDER — BENZONATATE 200 MG PO CAPS: 200.0000 mg | ORAL_CAPSULE | Freq: Three times a day (TID) | ORAL | 0 refills | Status: DC | PRN

## 2021-08-04 NOTE — Discharge Instructions (Signed)
Rest and drink lots of fluids Run a humidifier in your bedroom if you have 1 Take the Tessalon 3 times a day with fluids.  Take in addition to Mucinex DM. If you do not see improvement over the next couple of days add azithromycin (Z-Pak) Call if you need [place] to change your work note

## 2021-08-04 NOTE — ED Triage Notes (Addendum)
Cough x 2 days  Denies fever Mucinex OTC  Diagnosed w/ flu this past Tuesday - finished tamiflu

## 2021-08-08 NOTE — ED Provider Notes (Signed)
L[facility] Gregory CARE    CSN: [id number] Arrival date & time: 08/04/21  1304      History   Chief Complaint Chief Complaint  Patient presents with   Cough    HPI Lydia Gregory is a 30 y.o. female.   HPI Patient was diagnosed with flu last week.  Took 5 days of her Tamiflu.  States that she felt somewhat better but now has developed a cough.  She states she is using Mucinex but she has a productive cough harsh, pain with coughing in the anterior chest.  She is able to breathe normally with no shortness of breath.  No underlying lung disease  Past Medical History:  Diagnosis Date   ADD (attention deficit disorder)    Anxiety and depression    Depressed    Renal disorder    Thyroid disease     Patient Active Problem List   Diagnosis Date Noted   Abnormal weight gain 05/04/2021   Pre-op evaluation 12/05/2020   Breast hypertrophy in female 12/05/2020   Back pain 01/12/2020   Sinus tachycardia 05/25/2019   Transaminitis 12/31/2017   No energy 01/02/2017   Hypothyroidism 01/02/2017   Inattention 01/02/2017   Attention deficit hyperactivity disorder (ADHD), predominantly inattentive type 01/02/2017   Class 2 obesity due to excess calories without serious comorbidity with body mass index (BMI) of 37.0 to 37.9 in adult 08/20/2016   Vitamin D deficiency 08/20/2016   MDD (major depressive disorder), recurrent episode, moderate ([place]) 08/20/2016   GAD (generalized anxiety disorder) 08/20/2016    Past Surgical History:  Procedure Laterality Date   CYSTOSCOPY KIDNEY W/ URETERAL GUIDE WIRE     KIDNEY STONE SURGERY     WISDOM TOOTH EXTRACTION      OB History   No obstetric history on file.      Home Medications    Prior to Admission medications   Medication Sig Start Date End Date Taking? Authorizing Provider  azithromycin (ZITHROMAX) 250 MG tablet Take 1 tablet (250 mg total) by mouth daily. Take first 2 tablets together, then 1 every day until finished.  08/04/21  Yes Raylene Everts, MD  ALPRAZolam Duanne Moron) 0.5 MG tablet TAKE 1 TABLET BY MOUTH TWICE A DAY AS NEEDED FOR ANXIETY 10/12/18   Breeback, Jade L, PA-C  Armodafinil 200 MG TABS Take by mouth. 07/13/21   [provider]  benzonatate (TESSALON) 200 MG capsule Take 1 capsule (200 mg total) by mouth 3 (three) times daily as needed for cough. 08/04/21   Raylene Everts, MD  buPROPion (WELLBUTRIN XL) 300 MG 24 hr tablet Take 300 mg by mouth every morning. 05/11/21   [provider]  etonogestrel (NEXPLANON) 68 MG IMPL implant Inserted 12/31/17 Lot# [id number] 12/31/17   Emeterio Reeve, DO  fexofenadine (ALLEGRA) 180 MG tablet Take 180 mg by mouth daily.    [provider]  FLUoxetine (PROZAC) 20 MG capsule Take 20 mg by mouth every morning. 01/05/20   [provider]  lamoTRIgine (LAMICTAL) 150 MG tablet Take 150 mg by mouth daily. 12/16/19   [provider]  levothyroxine (SYNTHROID) 125 MCG tablet 1 tab by mouth Monday through Friday. 1.5 tab by mouth Saturday and Sunday. Take on empty stomach 30 min before food/other meds 12/25/20   Iran Planas L, PA-C  Probiotic Product (PROBIOTIC-10 PO) Take by mouth daily.    [provider]  propranolol (INDERAL) 10 MG tablet TAKE 1 TABLET BY MOUTH THREE TIMES A DAY 07/20/21   Breeback,  Jade L, PA-C  spironolactone (ALDACTONE) 50 MG tablet Take 150 mg by mouth daily.     [provider]    Family History Family History  Problem Relation Age of Onset   Diabetes Mother    Hypertension Mother    Hypertension Father     Social History Social History   Tobacco Use   Smoking status: Never   Smokeless tobacco: Never  Vaping Use   Vaping Use: Never used  Substance Use Topics   Alcohol use: Yes    Alcohol/week: 3.0 standard drinks    Types: 3 Glasses of wine per week   Drug use: No     Allergies   Amoxicillin, Penicillins, and Septra [sulfamethoxazole-trimethoprim]   Review of  Systems Review of Systems See HPI  Physical Exam Triage Vital Signs ED Triage Vitals  Enc Vitals Group     BP 08/04/21 1520 (!) 156/87     Pulse Rate 08/04/21 1520 (!) 126     Resp 08/04/21 1520 (!) 22     Temp 08/04/21 1520 99 F (37.2 C)     Temp Source 08/04/21 1520 Oral     SpO2 08/04/21 1520 100 %     Weight 08/04/21 1458 200 lb (90.7 kg)     Height 08/04/21 1458 5\' 4"  (1.626 m)     Head Circumference --      Peak Flow --      Pain Score 08/04/21 1458 0     Pain Loc --      Pain Edu? --      Excl. in GC? --    No data found.  Updated Vital Signs BP (!) 156/87 (BP Location: Right Arm)   Pulse (!) 126   Temp 99 F (37.2 C) (Oral)   Resp (!) 22   Ht 5\' 4"  (1.626 m)   Wt 90.7 kg   SpO2 100%   BMI 34.33 kg/m      Physical Exam Constitutional:      General: She is not in acute distress.    Appearance: She is well-developed. She is ill-appearing.  HENT:     Head: Normocephalic and atraumatic.     Right Ear: Tympanic membrane and ear canal normal.     Left Ear: Tympanic membrane and ear canal normal.     Nose: Nose normal. No congestion.     Mouth/Throat:     Mouth: Mucous membranes are moist.     Pharynx: No posterior oropharyngeal erythema.  Eyes:     Conjunctiva/sclera: Conjunctivae normal.     Pupils: Pupils are equal, round, and reactive to light.  Cardiovascular:     Rate and Rhythm: Normal rate and regular rhythm.     Heart sounds: Normal heart sounds.  Pulmonary:     Effort: Pulmonary effort is normal. No respiratory distress.     Breath sounds: Rhonchi present. No wheezing or rales.  Abdominal:     General: There is no distension.     Palpations: Abdomen is soft.  Musculoskeletal:        General: Normal range of motion.     Cervical back: Normal range of motion.  Lymphadenopathy:     Cervical: No cervical adenopathy.  Skin:    General: Skin is warm and dry.  Neurological:     Mental Status: She is alert.     UC Treatments / Results   Labs (all labs ordered are listed, but only abnormal results are displayed) Labs Reviewed - No data  to display  EKG   Radiology No results found.  Procedures Procedures (including critical care time)  Medications Ordered in UC Medications - No data to display  Initial Impression / Assessment and Plan / UC Course  I have reviewed the triage vital signs and the nursing notes.  Pertinent labs & imaging results that were available during my care of the patient were reviewed by me and considered in my medical decision making (see chart for details).     Concern for developing cough in the aftermath of influenza.  Will treat with antibiotics and cough suppression, rest and fluids, return as needed Final Clinical Impressions(s) / UC Diagnoses   Final diagnoses:  Influenza-like illness     Discharge Instructions      Rest and drink lots of fluids Run a humidifier in your bedroom if you have 1 Take the Tessalon 3 times a day with fluids.  Take in addition to Mucinex DM. If you do not see improvement over the next couple of days add azithromycin (Z-Pak) Call if you need [place] to change your work note   ED Prescriptions     Medication Sig Dispense Auth. Provider   benzonatate (TESSALON) 200 MG capsule Take 1 capsule (200 mg total) by mouth 3 (three) times daily as needed for cough. 45 capsule Raylene Everts, MD   azithromycin (ZITHROMAX) 250 MG tablet Take 1 tablet (250 mg total) by mouth daily. Take first 2 tablets together, then 1 every day until finished. 6 tablet Raylene Everts, MD      PDMP not reviewed this encounter.   Raylene Everts, MD 08/08/21 [phone]

## 2021-10-04 DIAGNOSIS — S728X1A Other fracture of right femur, initial encounter for closed fracture: Secondary | ICD-10-CM | POA: Insufficient documentation

## 2021-10-10 ENCOUNTER — Telehealth: Payer: Self-pay | Admitting: General Practice
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
## 2021-10-23 ENCOUNTER — Encounter: Payer: Self-pay | Admitting: Physician Assistant

## 2021-10-31 NOTE — Telephone Encounter (Signed)
Please reprint labs for patient to have drawn.

## 2021-11-01 NOTE — Telephone Encounter (Signed)
Labs don't need printed, will be in lab system.

## 2021-12-09 IMAGING — DX DG ABDOMEN 1V
2 series · 2 of 2 positions shown · non-contrast
Comparison: None.

CLINICAL DATA: Bilateral low back pain and urinary frequency for 3
days, worsening.

EXAM:
ABDOMEN - 1 VIEW

[abdomen kub (1 of 2)]
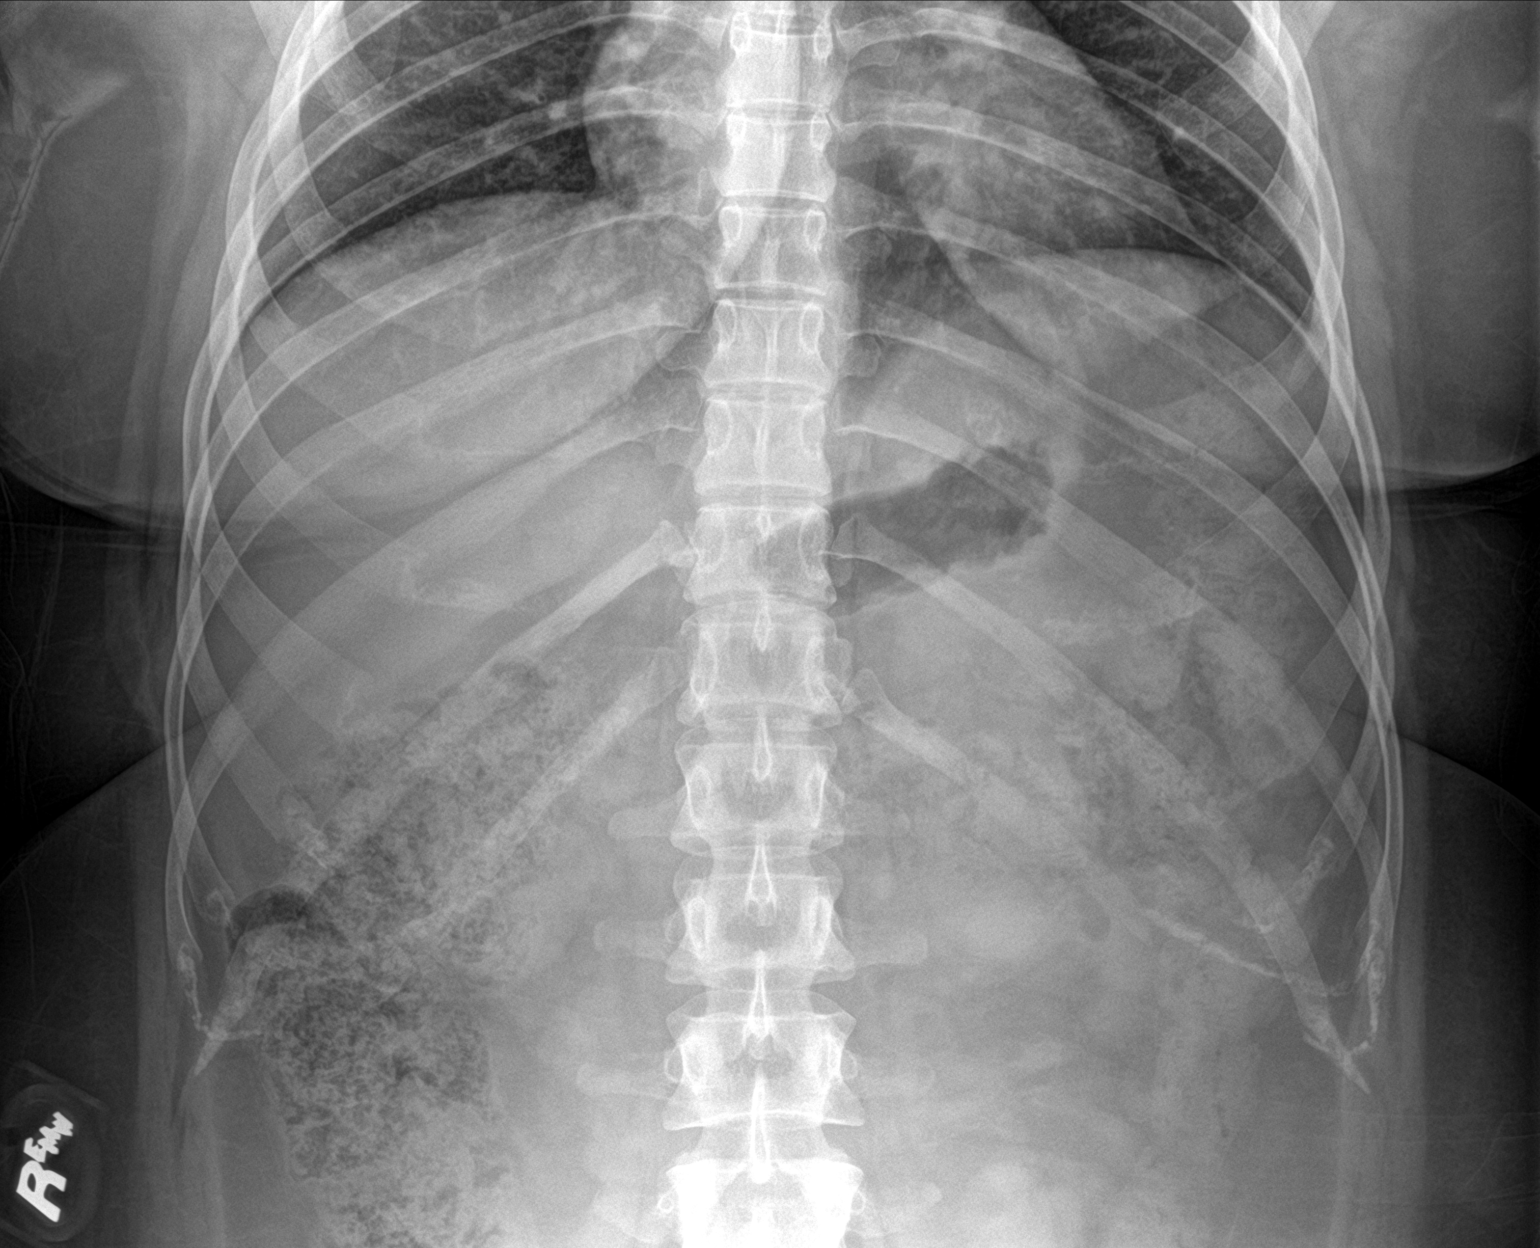

[abdomen kub (2 of 2)]
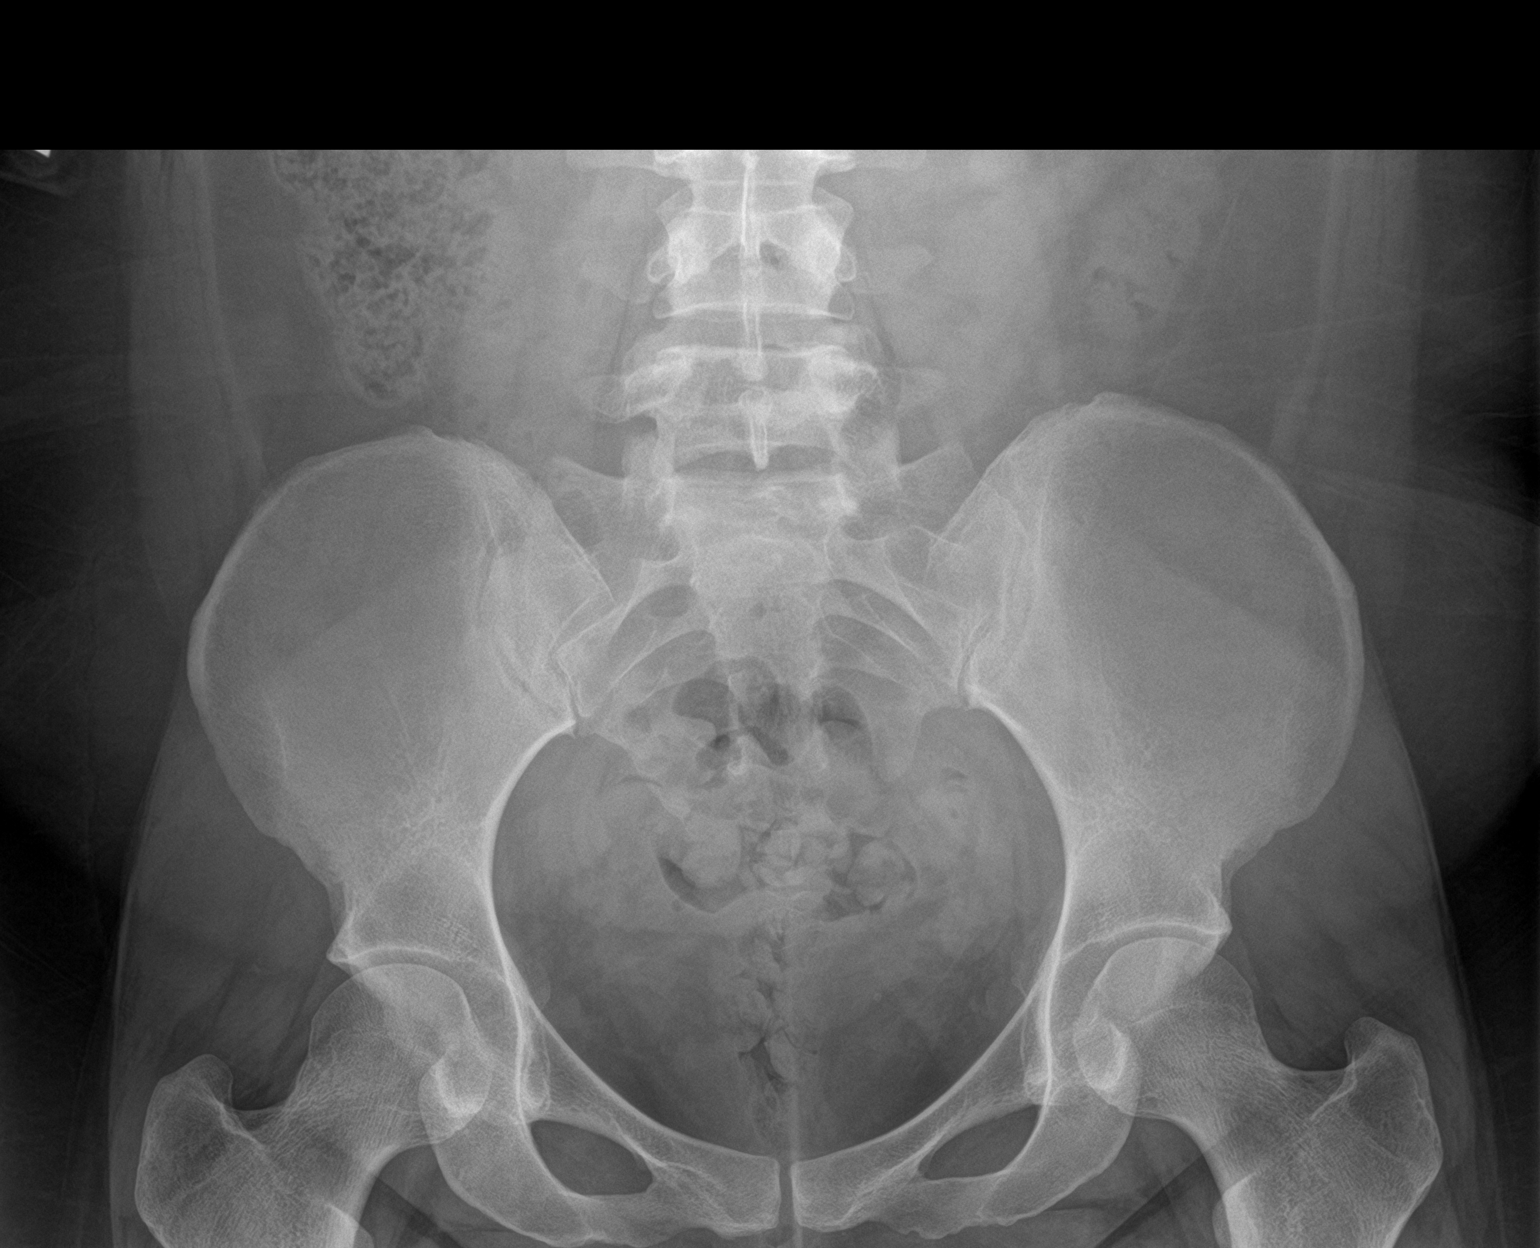

[2 of 2 positions shown; findings below may reference images not displayed]

FINDINGS: The bowel gas pattern is normal. Large volume of stool ascending and
transverse colon. No radio-opaque calculi or other significant
radiographic abnormality are seen.
IMPRESSION: No acute abnormality.  No evidence of urinary tract stone.

Large stool burden ascending and transverse colon.

## 2022-01-17 ENCOUNTER — Other Ambulatory Visit: Payer: Self-pay | Admitting: Physician Assistant

## 2022-01-17 DIAGNOSIS — R000 Tachycardia, unspecified: Secondary | ICD-10-CM

## 2022-01-17 DIAGNOSIS — E039 Hypothyroidism, unspecified: Secondary | ICD-10-CM

## 2022-01-30 NOTE — Progress Notes (Unsigned)
   Established Patient Office Visit  Subjective   Patient ID: Lydia Gregory, female    DOB: 07/27/2000  Age: 32 y.o. MRN: [id number]  No chief complaint on file.   HPI   ROS    Objective:    There were no vitals filed for this visit.   Physical Exam    No results found for this or any previous visit (from the past 24 hour(s)).   {Labs (Optional):23779}  The ASCVD Risk score (Arnett DK, et al., 2019) failed to calculate for the following reasons:   The 2019 ASCVD risk score is only valid for ages 67 to 19   Assessment & Plan:   No problem-specific Assessment & Plan notes found for this encounter.   No follow-ups on file.  ___________________________________________ Clearnce Sorrel, DNP, APRN, FNP-BC Primary Care and [place]

## 2022-01-31 ENCOUNTER — Encounter: Payer: Self-pay | Admitting: Medical-Surgical

## 2022-01-31 ENCOUNTER — Ambulatory Visit: Payer: BC Managed Care – PPO | Admitting: Medical-Surgical

## 2022-01-31 VITALS — BP 109/77 | HR 99 | Resp 20 | Ht 64.0 in | Wt 230.3 lb

## 2022-01-31 DIAGNOSIS — Z3046 Encounter for surveillance of implantable subdermal contraceptive: Secondary | ICD-10-CM | POA: Insufficient documentation

## 2022-01-31 LAB — POCT URINE PREGNANCY: Preg Test, Ur: NEGATIVE

## 2022-02-07 ENCOUNTER — Other Ambulatory Visit: Payer: Self-pay | Admitting: Physician Assistant

## 2022-02-07 DIAGNOSIS — E039 Hypothyroidism, unspecified: Secondary | ICD-10-CM

## 2022-02-17 ENCOUNTER — Other Ambulatory Visit: Payer: Self-pay | Admitting: Physician Assistant

## 2022-02-17 DIAGNOSIS — R000 Tachycardia, unspecified: Secondary | ICD-10-CM

## 2022-02-17 DIAGNOSIS — E039 Hypothyroidism, unspecified: Secondary | ICD-10-CM

## 2022-03-02 ENCOUNTER — Other Ambulatory Visit: Payer: Self-pay | Admitting: Physician Assistant

## 2022-03-02 DIAGNOSIS — R000 Tachycardia, unspecified: Secondary | ICD-10-CM

## 2022-03-02 DIAGNOSIS — E039 Hypothyroidism, unspecified: Secondary | ICD-10-CM

## 2022-03-10 NOTE — Progress Notes (Signed)
Established Patient Office Visit  Subjective   Patient ID: Lydia Gregory, female   DOB: [birth year] Age: 31 y.o. MRN: [id number]   Chief Complaint  Patient presents with   Follow-up    HPI Pleasant 31 year old female presenting today to follow up on recent Nexplanon removal and reinsertion.  Since the procedure, she has done very well and has healed beautifully with no signs or symptoms of infection.  She did have some bruising at the site but this has fully resolved.  Has been able to palpate her Nexplanon without difficulty.  She has not had a menstrual cycle since the Nexplanon was replaced.  No skin changes, mood changes, or weight fluctuations that she feels are related to the Nexplanon.  Notes that she is due for some labs that were ordered by her PCP and would like to have that done today.    Objective:    Vitals:   03/11/22 1427  BP: 117/84  Pulse: (!) 124  Resp: 20  Height: 5\' 4"  (1.626 m)  Weight: 231 lb 14.4 oz (105.2 kg)  SpO2: 98%  BMI (Calculated): 39.79    Physical Exam Vitals and nursing note reviewed.  Constitutional:      General: She is not in acute distress.    Appearance: Normal appearance. She is not ill-appearing.  HENT:     Head: Normocephalic and atraumatic.  Cardiovascular:     Rate and Rhythm: Normal rate and regular rhythm.  Pulmonary:     Effort: Pulmonary effort is normal. No respiratory distress.  Musculoskeletal:     Comments: Nexplanon palpable at the inner left upper arm.  Skin:    General: Skin is warm and dry.  Neurological:     Mental Status: She is alert and oriented to person, place, and time.  Psychiatric:        Mood and Affect: Mood normal.        Behavior: Behavior normal.        Thought Content: Thought content normal.        Judgment: Judgment normal.   No results found for this or any previous visit (from the past 24 hour(s)).     The ASCVD Risk score (Arnett DK, et al., 2019) failed to calculate for the  following reasons:   The 2019 ASCVD risk score is only valid for ages 54 to 32   Assessment & Plan:   1. Encounter for surveillance of Nexplanon subdermal contraceptive Nexplanon site has healed very well and Nexplanon is palpable in the left upper arm.  She is tolerating the new device well and is happy with this form of birth control.  She will be due to have this removed and replaced if desired in 3 years.  2. Abnormal weight gain Labs reentered from PCP orders due to order expiration in our system. - DHEA-sulfate - Lipid Panel w/reflex Direct LDL - ACTH - Cortisol, free, Serum - CP Testosterone, BIO-Female/Children - COMPLETE METABOLIC PANEL WITH GFR - Hemoglobin A1c - Thyroid Panel With TSH  3. Acquired hypothyroidism Labs reentered for thyroid panel. - Thyroid Panel With TSH  4. Screening for lipid disorders Lab reentered for lipid panel. - Lipid Panel w/reflex Direct LDL  5. Class 2 obesity due to excess calories without serious comorbidity with body mass index (BMI) of 37.0 to 37.9 in adult Labs as below. - DHEA-sulfate - Lipid Panel w/reflex Direct LDL - ACTH - Cortisol, free, Serum - CP Testosterone, BIO-Female/Children - COMPLETE METABOLIC PANEL WITH GFR -  Hemoglobin A1c - Thyroid Panel With TSH  Return if symptoms worsen or fail to improve.  ___________________________________________ Thayer Ohm, DNP, APRN, FNP-BC Primary Care and Sports Medicine [facility]

## 2022-03-11 ENCOUNTER — Ambulatory Visit: Payer: BC Managed Care – PPO | Admitting: Medical-Surgical

## 2022-03-11 ENCOUNTER — Encounter: Payer: Self-pay | Admitting: Medical-Surgical

## 2022-03-11 VITALS — BP 117/84 | HR 124 | Resp 20 | Ht 64.0 in | Wt 231.9 lb

## 2022-03-11 DIAGNOSIS — Z13220 Encounter for screening for lipoid disorders: Secondary | ICD-10-CM

## 2022-03-11 DIAGNOSIS — Z3046 Encounter for surveillance of implantable subdermal contraceptive: Secondary | ICD-10-CM

## 2022-03-11 DIAGNOSIS — Z6837 Body mass index (BMI) 37.0-37.9, adult: Secondary | ICD-10-CM

## 2022-03-11 DIAGNOSIS — Z789 Other specified health status: Secondary | ICD-10-CM | POA: Insufficient documentation

## 2022-03-11 DIAGNOSIS — R635 Abnormal weight gain: Secondary | ICD-10-CM | POA: Diagnosis not present

## 2022-03-11 DIAGNOSIS — E039 Hypothyroidism, unspecified: Secondary | ICD-10-CM | POA: Diagnosis not present

## 2022-03-11 DIAGNOSIS — E6609 Other obesity due to excess calories: Secondary | ICD-10-CM | POA: Diagnosis not present

## 2022-03-11 DIAGNOSIS — Z6839 Body mass index (BMI) 39.0-39.9, adult: Secondary | ICD-10-CM | POA: Diagnosis not present

## 2022-03-11 DIAGNOSIS — T1490XA Injury, unspecified, initial encounter: Secondary | ICD-10-CM | POA: Insufficient documentation

## 2022-03-13 NOTE — Progress Notes (Signed)
A1C is 5.4 and looks good.  Thyroid looks good.

## 2022-03-18 NOTE — Progress Notes (Signed)
ACTH is normal.  Testosterone and DHEA looks good.   HDL, good cholesterol, looks good.  TG and and LDL a little elevated. Keep working on diet and exercise.

## 2022-03-20 LAB — COMPLETE METABOLIC PANEL WITH GFR
AG Ratio: 1.8 (calc) (ref 1.0–2.5)
ALT: 26 U/L (ref 6–29)
AST: 15 U/L (ref 10–30)
Albumin: 4.6 g/dL (ref 3.6–5.1)
Alkaline phosphatase (APISO): 104 U/L (ref 31–125)
BUN: 7 mg/dL (ref 7–25)
CO2: 23 mmol/L (ref 20–32)
Calcium: 9.8 mg/dL (ref 8.6–10.2)
Chloride: 105 mmol/L (ref 98–110)
Creat: 0.75 mg/dL (ref 0.50–0.96)
Globulin: 2.6 g/dL (calc) (ref 1.9–3.7)
Glucose, Bld: 89 mg/dL (ref 65–99)
Potassium: 3.9 mmol/L (ref 3.5–5.3)
Sodium: 140 mmol/L (ref 135–146)
Total Bilirubin: 1.1 mg/dL (ref 0.2–1.2)
Total Protein: 7.2 g/dL (ref 6.1–8.1)
eGFR: 111 mL/min/{1.73_m2} (ref >=60)

## 2022-03-20 LAB — DHEA-SULFATE: DHEA-SO4: 282 ug/dL (ref 14–349)

## 2022-03-20 LAB — CP TESTOSTERONE, BIO-FEMALE/CHILDREN
Albumin: 4.7 g/dL (ref 3.6–5.1)
Sex Hormone Binding: 16.6 nmol/L — ABNORMAL LOW (ref 17–124)
TESTOSTERONE, BIOAVAILABLE: 7.1 ng/dL (ref 0.5–8.5)
Testosterone, Free: 3.3 pg/mL (ref 0.2–5.0)
Testosterone, Total, LC-MS-MS: 18 ng/dL (ref 2–45)

## 2022-03-20 LAB — HEMOGLOBIN A1C
Hgb A1c MFr Bld: 5.4 % of total Hgb (ref <5.7)
Mean Plasma Glucose: 108 mg/dL
eAG (mmol/L): 6 mmol/L

## 2022-03-20 LAB — LIPID PANEL W/REFLEX DIRECT LDL
Cholesterol: 206 mg/dL — ABNORMAL HIGH (ref <200)
HDL: 50 mg/dL (ref >=50)
LDL Cholesterol (Calc): 129 mg/dL (calc) — ABNORMAL HIGH
Non-HDL Cholesterol (Calc): 156 mg/dL (calc) — ABNORMAL HIGH (ref <130)
Total CHOL/HDL Ratio: 4.1 (calc) (ref <5.0)
Triglycerides: 156 mg/dL — ABNORMAL HIGH (ref <150)

## 2022-03-20 LAB — CORTISOL, FREE: Cortisol Free, Ser: 0.41 ug/dL

## 2022-03-20 LAB — THYROID PANEL WITH TSH
Free Thyroxine Index: 3.2 (ref 1.4–3.8)
T3 Uptake: 31 % (ref 22–35)
T4, Total: 10.2 ug/dL (ref 5.1–11.9)
TSH: 0.76 mIU/L

## 2022-03-20 LAB — ACTH: C206 ACTH: 28 pg/mL (ref 6–50)

## 2022-03-21 NOTE — Progress Notes (Signed)
Cortisol is normal.

## 2022-03-26 ENCOUNTER — Telehealth: Payer: BC Managed Care – PPO | Admitting: Emergency Medicine

## 2022-03-26 DIAGNOSIS — R0602 Shortness of breath: Secondary | ICD-10-CM | POA: Diagnosis not present

## 2022-03-26 MED ORDER — ALBUTEROL SULFATE HFA 108 (90 BASE) MCG/ACT IN AERS: 2.0000 | INHALATION_SPRAY | RESPIRATORY_TRACT | 0 refills | Status: DC | PRN
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
## 2022-04-19 ENCOUNTER — Encounter: Payer: Self-pay | Admitting: Physician Assistant

## 2022-04-19 ENCOUNTER — Ambulatory Visit (INDEPENDENT_AMBULATORY_CARE_PROVIDER_SITE_OTHER): Payer: BC Managed Care – PPO | Admitting: Physician Assistant

## 2022-04-19 VITALS — BP 122/62 | HR 97 | Ht 64.0 in | Wt 231.0 lb

## 2022-04-19 DIAGNOSIS — E6609 Other obesity due to excess calories: Secondary | ICD-10-CM

## 2022-04-19 DIAGNOSIS — Z3046 Encounter for surveillance of implantable subdermal contraceptive: Secondary | ICD-10-CM

## 2022-04-19 DIAGNOSIS — Z0000 Encounter for general adult medical examination without abnormal findings: Secondary | ICD-10-CM

## 2022-04-19 DIAGNOSIS — R000 Tachycardia, unspecified: Secondary | ICD-10-CM

## 2022-04-19 DIAGNOSIS — E039 Hypothyroidism, unspecified: Secondary | ICD-10-CM | POA: Diagnosis not present

## 2022-04-19 DIAGNOSIS — Z6839 Body mass index (BMI) 39.0-39.9, adult: Secondary | ICD-10-CM

## 2022-04-19 MED ORDER — INSULIN PEN NEEDLE 31G X 6 MM MISC
0 refills | Status: DC
Start: 2022-04-19 — End: 2022-06-28

## 2022-04-19 MED ORDER — SAXENDA 18 MG/3ML ~~LOC~~ SOPN: 3.0000 mg | PEN_INJECTOR | Freq: Every day | SUBCUTANEOUS | 0 refills | Status: DC

## 2022-04-19 NOTE — Progress Notes (Unsigned)
Complete physical exam  Patient: Lydia Gregory   DOB: 1991-02-19   30 y.o. Female  MRN: [id number]  Subjective:    Chief Complaint  Patient presents with   Annual Exam    Lydia Gregory is a 31 y.o. female who presents today for a complete physical exam. She reports consuming a {diet types:17450} diet. {types:19826} She generally feels {DESC; WELL/FAIRLY WELL/POORLY:18703}. She reports sleeping {DESC; WELL/FAIRLY WELL/POORLY:18703}. She {does/does not:200015} have additional problems to discuss today.   Right femur fracture not walking for 3 months Most recent fall risk assessment:    03/11/2022    2:53 PM  Fall Risk   Falls in the past year? 1  Number falls in past yr: 1  Injury with Fall? 0  Risk for fall due to : History of fall(s)  Follow up Falls evaluation completed     Most recent depression screenings:    03/11/2022    3:02 PM 05/04/2021   10:55 AM  PHQ 2/9 Scores  PHQ - 2 Score 1 3  PHQ- 9 Score 8 12    {VISON DENTAL STD PSA (Optional):27386}  {History (Optional):23778}  Patient Care Team: Nolene Ebbs as PCP - General (Family Medicine)   Outpatient Medications Prior to Visit  Medication Sig   ALPRAZolam (XANAX) 0.5 MG tablet TAKE 1 TABLET BY MOUTH TWICE A DAY AS NEEDED FOR ANXIETY   amphetamine-dextroamphetamine (ADDERALL) 10 MG tablet Take 10 mg by mouth 2 (two) times daily.   cloNIDine HCl (KAPVAY) 0.1 MG TB12 ER tablet Take 2 tablets by mouth at bedtime.   etonogestrel (NEXPLANON) 68 MG IMPL implant Inserted 12/31/17 Lot# [id number]   fexofenadine (ALLEGRA) 180 MG tablet Take 180 mg by mouth daily.   lamoTRIgine (LAMICTAL) 150 MG tablet Take 150 mg by mouth daily.   levothyroxine (SYNTHROID) 125 MCG tablet 1 TAB BY MOUTH MON THRU FRI. 1.5 TAB SAT AND SUN TAKE ON EMPTY STOMACH 30 MIN BEFORE FOOD/OTHER MEDS   Probiotic Product (PROBIOTIC-10 PO) Take by mouth daily.   propranolol (INDERAL) 10 MG tablet TAKE 1 TABLET (10 MG TOTAL) BY MOUTH 3  (THREE) TIMES DAILY. NEEDS APPT FOR REFILLS   RESTASIS 0.05 % ophthalmic emulsion 1 drop 2 (two) times daily.   [DISCONTINUED] albuterol (VENTOLIN HFA) 108 (90 Base) MCG/ACT inhaler Inhale 2 puffs into the lungs every 4 (four) hours as needed for wheezing or shortness of breath.   [DISCONTINUED] cloNIDine HCl (KAPVAY) 0.1 MG TB12 ER tablet Take 0.2 mg by mouth at bedtime.   No facility-administered medications prior to visit.    ROS        Objective:     BP 122/62   Pulse 97   Ht 5\' 4"  (1.626 m)   Wt 231 lb (104.8 kg)   SpO2 100%   BMI 39.65 kg/m  {Vitals History (Optional):23777}  Physical Exam   No results found for any visits on 04/19/22. {Show previous labs (optional):23779}    Assessment & Plan:    Routine Health Maintenance and Physical Exam  Immunization History  Administered Date(s) Administered   Influenza Inj Mdck Quad Pf 10/15/2013, 08/20/2015, 06/02/2016   Influenza, High Dose Seasonal PF 07/19/2011   Influenza, Seasonal, Injecte, Preservative Fre 10/15/2013, 08/20/2015, 06/02/2016   Influenza,inj,Quad PF,6+ Mos 05/04/2021   Influenza,inj,quad, With Preservative 06/10/2018   Influenza-Unspecified 10/22/2017, 09/05/2020   PFIZER(Purple Top)SARS-COV-2 Vaccination 11/03/2019, 11/24/2019, 09/05/2020   PPD Test 04/05/2016   Td 01/08/2017   Tdap 02/08/2016    Health Maintenance  Topic  Date Due   HIV Screening  Never done   Hepatitis C Screening  Never done   INFLUENZA VACCINE  04/09/2022   COVID-19 Vaccine (4 - Pfizer risk series) 05/05/2022 (Originally 10/31/2020)   PAP-Cervical Cytology Screening  05/12/2023   PAP SMEAR-Modifier  09/07/2023   TETANUS/TDAP  01/09/2027   HPV VACCINES  Aged Out    Discussed health benefits of physical activity, and encouraged her to engage in regular exercise appropriate for her age and condition.    No follow-ups on file.     Tandy Gaw, PA-C

## 2022-04-19 NOTE — Patient Instructions (Signed)
Liraglutide Injection (Weight Management) What is this medication? LIRAGLUTIDE (LIR a GLOO tide) promotes weight loss. It may also be used to maintain weight loss. It works by decreasing appetite. Changes to diet and exercise are often combined with this medication. This medicine may be used for other purposes; ask your health care provider or pharmacist if you have questions. COMMON BRAND NAME(S): Saxenda What should I tell my care team before I take this medication? They need to know if you have any of these conditions: Endocrine tumors (MEN 2) or if someone in your family had these tumors Gallbladder disease High cholesterol History of alcohol abuse problem History of pancreatitis Kidney disease or if you are on dialysis Liver disease Previous swelling of the tongue, face, or lips with difficulty breathing, difficulty swallowing, hoarseness, or tightening of the throat Stomach problems Suicidal thoughts, plans, or attempt; a previous suicide attempt by you or a family member Thyroid cancer or if someone in your family had thyroid cancer An unusual or allergic reaction to liraglutide, other medications, foods, dyes, or preservatives Pregnant or trying to get pregnant Breast-feeding How should I use this medication? This medication is for injection under the skin of your upper leg, stomach area, or upper arm. You will be taught how to prepare and give this medication. Use exactly as directed. Take your medication at regular intervals. Do not take it more often than directed. This medication comes with INSTRUCTIONS FOR USE. Ask your pharmacist for directions on how to use this medication. Read the information carefully. Talk to your pharmacist or care team if you have questions. It is important that you put your used needles and syringes in a special sharps container. Do not put them in a trash can. If you do not have a sharps container, call your pharmacist or care team to get one. A  special MedGuide will be given to you by the pharmacist with each prescription and refill. Be sure to read this information carefully each time. Talk to your care team about the use of this medication in children. While it may be prescribed for children as young as 58 years of age for selected conditions, precautions do apply. Overdosage: If you think you have taken too much of this medicine contact a poison control center or emergency room at once. NOTE: This medicine is only for you. Do not share this medicine with others. What if I miss a dose? If you miss a dose, take it as soon as you can. If it is almost time for your next dose, take only that dose. Do not take double or extra doses. If you miss your dose for 3 days or more, call your care team to talk about how to restart this medicine. What may interact with this medication? Insulin and other medications for diabetes This list may not describe all possible interactions. Give your health care provider a list of all the medicines, herbs, non-prescription drugs, or dietary supplements you use. Also tell them if you smoke, drink alcohol, or use illegal drugs. Some items may interact with your medicine. What should I watch for while using this medication? Visit your care team for regular checks on your progress. Drink plenty of fluids while taking this medication. Check with your care team if you get an attack of severe diarrhea, nausea, and vomiting. The loss of too much body fluid can make it dangerous for you to take this medication. This medication may affect blood sugar levels. Ask your care team if  changes in diet or medications are needed if you have diabetes. Patients and their families should watch out for worsening depression or thoughts of suicide. Also watch out for sudden changes in feelings such as feeling anxious, agitated, panicky, irritable, hostile, aggressive, impulsive, severely restless, overly excited and hyperactive, or not  being able to sleep. If this happens, especially at the beginning of treatment or after a change in dose, call your care team. Women should inform their care team if they wish to become pregnant or think they might be pregnant. Losing weight while pregnant is not advised and may cause harm to the unborn child. Talk to your care team for more information. What side effects may I notice from receiving this medication? Side effects that you should report to your care team as soon as possible: Allergic reactions or angioedema--skin rash, itching, hives, swelling of the face, eyes, lips, tongue, arms, or legs, trouble swallowing or breathing Fast or irregular heartbeat Gallbladder problems--severe stomach pain, nausea, vomiting, fever Kidney injury--decrease in the amount of urine, swelling of the ankles, hands, or feet Pancreatitis--severe stomach pain that spreads to your back or gets worse after eating or when touched, fever, nausea, vomiting Thoughts of suicide or self-harm, worsening mood, feelings of depression Thyroid cancer--new mass or lump in the neck, pain or trouble swallowing, trouble breathing, hoarseness Side effects that usually do not require medical attention (report to your care team if they continue or are bothersome): Constipation Dizziness Fatigue Headache Loss of Appetite Nausea Upset stomach This list may not describe all possible side effects. Call your doctor for medical advice about side effects. You may report side effects to FDA at 1-800-FDA-1088. Where should I keep my medication? Keep out of the reach of children and pets. Store unopened pen in a refrigerator between 2 and 8 degrees C (36 and 46 degrees F). Do not freeze or use if the medication has been frozen. Protect from light and excessive heat. After you first use the pen, it can be stored at room temperature between 15 and 30 degrees C (59 and 86 degrees F) or in a refrigerator. Throw away your used pen after 30  days or after the expiration date, whichever comes first. Do not store your pen with the needle attached. If the needle is left on, medication may leak from the pen. NOTE: This sheet is a summary. It may not cover all possible information. If you have questions about this medicine, talk to your doctor, pharmacist, or health care provider.  2023 Elsevier/Gold Standard (2020-09-29 00:00:00)

## 2022-05-05 ENCOUNTER — Encounter: Payer: Self-pay | Admitting: Physician Assistant

## 2022-05-16 ENCOUNTER — Telehealth: Payer: Self-pay

## 2022-05-16 NOTE — Telephone Encounter (Addendum)
Initiated Prior authorization JZP:HXTAVWP 18MG /3ML pen-injectors Via: Covermymeds Case/Key:[id number] Status: denied  as of 05/16/22 Reason:Your plan only covers this drug if you have been in a comprehensive weight management program for at least 6 months before starting this drug. We have denied your request because you have not been taking part in a comprehensive weight management program for at least 6 months. We reviewed the information we had. Your request has been denied. Your doctor can send 07/16/22 any new or missing information for [place] to review. For this drug, you may have to meet other criteria. You can request the drug policy for more details. You can also request other plan documents for your review. Notified Pt via: Mychart

## 2022-06-28 ENCOUNTER — Other Ambulatory Visit: Payer: Self-pay | Admitting: Physician Assistant

## 2022-06-29 ENCOUNTER — Other Ambulatory Visit: Payer: Self-pay

## 2022-06-29 ENCOUNTER — Ambulatory Visit
Admission: RE | Admit: 2022-06-29 | Discharge: 2022-06-29 | Disposition: A | Discharge status: Home / Self Care | Payer: BC Managed Care – PPO | Source: Ambulatory Visit

## 2022-06-29 VITALS — BP 122/85 | HR 101 | Temp 99.1°F | Resp 20 | Ht 64.0 in | Wt 220.0 lb

## 2022-06-29 DIAGNOSIS — J309 Allergic rhinitis, unspecified: Secondary | ICD-10-CM

## 2022-06-29 DIAGNOSIS — R059 Cough, unspecified: Secondary | ICD-10-CM

## 2022-06-29 MED ORDER — FEXOFENADINE HCL 180 MG PO TABS: 180.0000 mg | ORAL_TABLET | Freq: Every day | ORAL | 0 refills | Status: DC

## 2022-06-29 MED ORDER — HYDROCODONE BIT-HOMATROP MBR 5-1.5 MG/5ML PO SOLN: 5.0000 mL | Freq: Three times a day (TID) | ORAL | 0 refills | Status: DC | PRN

## 2022-06-29 MED ORDER — DOXYCYCLINE HYCLATE 100 MG PO CAPS: 100.0000 mg | ORAL_CAPSULE | Freq: Two times a day (BID) | ORAL | 0 refills | Status: AC

## 2022-06-29 MED ORDER — PREDNISONE 10 MG (21) PO TBPK: ORAL_TABLET | Freq: Every day | ORAL | 0 refills | Status: DC

## 2022-06-29 NOTE — Discharge Instructions (Addendum)
Advised patient to take medication as directed with food to completion.  Advised patient to take Sterapred Unipak and Allegra with first dose of doxycycline for the next 10 days.  Advised may discontinue Allegra after 5 days and use as needed for concurrent postnasal drainage/drip.  Advised may use Hycodan prior to sleep for cough.  Encouraged patient to increase daily water intake to 64 ounces per day while taking these medications.  Advised if symptoms worsen and/or unresolved please follow-up with PCP or here for further evaluation.

## 2022-06-29 NOTE — ED Triage Notes (Signed)
Pt presents to Urgent Care with c/o nasal congestion x several weeks, attributed to allergies, and cough x 4-5 days. Reports that chest aches when coughing. Negative home COVID tests.

## 2022-06-29 NOTE — ED Provider Notes (Signed)
[facility]    CSN: [id number] Arrival date & time: 06/29/22  0946      History   Chief Complaint Chief Complaint  Patient presents with   Cough   Nasal Congestion    HPI Lydia Gregory is a 31 y.o. female.   HPI 31 year old female presents with nasal congestion and cough for 3 weeks.  Reports chest aches when coughing and has taken recent negative home COVID-19 tests.  PMH significant for morbid obesity, anxiety and depression, and ADD.  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      Home Medications    Prior to Admission medications   Medication Sig Start Date End Date Taking? Authorizing Provider  benzonatate (TESSALON) 100 MG capsule Take by mouth 3 (three) times daily as needed for cough. Pt has been taking but is now out (from  previous illness)   Yes [provider]  doxycycline (VIBRAMYCIN) 100 MG capsule Take 1 capsule (100 mg total) by mouth 2 (two) times daily for 10 days. 06/29/22 07/09/22 Yes Eliezer Lofts, FNP  fexofenadine [facility] ALLERGY) 180 MG tablet Take 1 tablet (180 mg total) by mouth daily for 15 days. 06/29/22 07/14/22 Yes Eliezer Lofts, FNP  HYDROcodone bit-homatropine (HYCODAN) 5-1.5 MG/5ML syrup Take 5 mLs by mouth every 8 (eight) hours as needed for cough. 06/29/22  Yes Eliezer Lofts, FNP  predniSONE (STERAPRED UNI-PAK 21 TAB) 10 MG (21) TBPK tablet Take by mouth daily. Take 6 tabs by mouth daily  for 2 days, then 5 tabs for 2 days, then 4 tabs for 2 days, then 3 tabs for 2 days, 2 tabs for 2 days, then 1 tab by mouth daily for 2 days 06/29/22  Yes Eliezer Lofts, FNP  ALPRAZolam Duanne Moron) 0.5 MG tablet TAKE 1 TABLET BY MOUTH TWICE A DAY AS NEEDED FOR ANXIETY 10/12/18   Breeback, Jade L, PA-C  amphetamine-dextroamphetamine (ADDERALL) 10 MG tablet Take 10 mg by mouth 2 (two) times daily. 01/23/22   [provider]  BD PEN NEEDLE NANO 2ND GEN 32G X 4 MM MISC USE WITH SAXENDA PEN DAILY. 06/28/22   Breeback, Jade L, PA-C  cloNIDine HCl (KAPVAY) 0.1 MG TB12 ER tablet Take 2 tablets by mouth at bedtime.    [provider]  etonogestrel (NEXPLANON) 68 MG IMPL implant  Inserted 12/31/17 Lot# [id number] 12/31/17   Sunnie Nielsen, DO  lamoTRIgine (LAMICTAL) 150 MG tablet Take 150 mg by mouth daily. 12/16/19   [provider]  levothyroxine (SYNTHROID) 125 MCG tablet 1 TAB BY MOUTH MON THRU FRI. 1.5 TAB SAT AND SUN TAKE ON EMPTY STOMACH 30 MIN BEFORE FOOD/OTHER MEDS 02/07/22   Breeback, Jade L, PA-C  Liraglutide -Weight Management (SAXENDA) 18 MG/3ML SOPN Inject 3 mg into the skin daily. 0.6 mg inj subcut daily for 1 week, then incr by 0.6 mg weekly until reaching 3 mg injected subcut daily 04/19/22   Breeback, Jade L, PA-C  Probiotic Product (PROBIOTIC-10 PO) Take by mouth daily.     [provider]  propranolol (INDERAL) 10 MG tablet TAKE 1 TABLET (10 MG TOTAL) BY MOUTH 3 (THREE) TIMES DAILY. NEEDS APPT FOR REFILLS 03/04/22   Breeback, Jade L, PA-C  RESTASIS 0.05 % ophthalmic emulsion 1 drop 2 (two) times daily. 12/03/21   [provider]    Family History Family History  Problem Relation Age of Onset   Diabetes Mother    Hypertension Mother    Hypertension Father     Social History Social History   Tobacco Use   Smoking status: Never   Smokeless tobacco: Never  Vaping Use   Vaping Use: Never used  Substance Use Topics   Alcohol use: Not Currently   Drug use: No     Allergies   Amoxicillin, Codeine, Penicillins, Septra [sulfamethoxazole-trimethoprim], and Sulfa antibiotics   Review of Systems Review of Systems  HENT:  Positive for congestion.   Respiratory:  Positive for cough.   All other systems reviewed and are negative.    Physical Exam Triage Vital Signs ED Triage Vitals  Enc Vitals Group     BP 06/29/22 1007 122/85     Pulse Rate 06/29/22 1007 (!) 101     Resp 06/29/22 1007 20     Temp 06/29/22 1007 99.1 F (37.3 C)     Temp Source 06/29/22 1007 Oral     SpO2 06/29/22 1007 98 %     Weight 06/29/22 1002 220 lb (99.8 kg)     Height 06/29/22 1002 5\' 4"  (1.626 m)     Head Circumference --      Peak Flow --      Pain Score 06/29/22 1001 3     Pain Loc --      Pain Edu? --      Excl. in GC? --    No data found.  Updated Vital Signs BP 122/85 (BP Location: Right Arm)   Pulse (!) 101   Temp 99.1 F (37.3 C) (Oral)   Resp 20   Ht 5\' 4"  (1.626 m)   Wt 220 lb (99.8 kg)   LMP  (LMP Unknown)   SpO2 98%   BMI 37.76 kg/m      Physical Exam Vitals and nursing note reviewed.  Constitutional:      General: She is not in acute distress.    Appearance: Normal appearance. She is obese. She is ill-appearing.  HENT:     Head: Normocephalic and atraumatic.     Right Ear: Tympanic membrane, ear canal and external  ear normal.     Left Ear: Tympanic membrane, ear canal and external ear normal.     Mouth/Throat:     Mouth: Mucous membranes are moist.     Pharynx: Oropharynx is clear.     Comments: Significant amount amount of clear drainage of  posterior oropharynx noted Eyes:     Extraocular Movements: Extraocular movements intact.     Conjunctiva/sclera: Conjunctivae normal.     Pupils: Pupils are equal, round, and reactive to light.  Cardiovascular:     Rate and Rhythm: Normal rate and regular rhythm.     Pulses: Normal pulses.     Heart sounds: Normal heart sounds.  Pulmonary:     Effort: Pulmonary effort is normal.     Breath sounds: Normal breath sounds. No wheezing, rhonchi or rales.     Comments: Frequent nonproductive cough noted on exam Musculoskeletal:        General: Normal range of motion.     Cervical back: Normal range of motion and neck supple.  Skin:    General: Skin is warm and dry.  Neurological:     General: No focal deficit present.     Mental Status: She is alert and oriented to person, place, and time. Mental status is at baseline.      UC Treatments / Results  Labs (all labs ordered are listed, but only abnormal results are displayed) Labs Reviewed - No data to display  EKG   Radiology No results found.  Procedures Procedures (including critical care time)  Medications Ordered in UC Medications - No data to display  Initial Impression / Assessment and Plan / UC Course  I have reviewed the triage vital signs and the nursing notes.  Pertinent labs & imaging results that were available during my care of the patient were reviewed by me and considered in my medical decision making (see chart for details).     MDM: 1.  Cough, unspecified type-Rx'd doxycycline, Sterapred Unipak, Hycodan; 2.  Allergic rhinitis-Rx'd Allegra. Advised patient to take medication as directed with food to completion.  Advised patient to take Sterapred Unipak and Allegra with first  dose of doxycycline for the next 10 days.  Advised may discontinue Allegra after 5 days and use as needed for concurrent postnasal drainage/drip.  Advised may use Hycodan prior to sleep for cough.  Encouraged patient to increase daily water intake to 64 ounces per day while taking these medications.  Advised if symptoms worsen and/or unresolved please follow-up with PCP or here for further evaluation.  Patient discharged home, hemodynamically stable. Final Clinical Impressions(s) / UC Diagnoses   Final diagnoses:  Cough, unspecified type  Allergic rhinitis, unspecified seasonality, unspecified trigger     Discharge Instructions      Advised patient to take medication as directed with food to completion.  Advised patient to take Sterapred Unipak and Allegra with first dose of doxycycline for the next 10 days.  Advised may discontinue Allegra after 5 days and use as needed for concurrent postnasal drainage/drip.  Advised may use Hycodan prior to sleep for cough.  Encouraged patient to increase daily water intake to 64 ounces per day while taking these medications.  Advised if symptoms worsen and/or unresolved please follow-up with PCP or here for further evaluation.     ED Prescriptions     Medication Sig Dispense Auth. Provider   doxycycline (VIBRAMYCIN) 100 MG capsule Take 1 capsule (100 mg total) by mouth 2 (two) times daily for 10 days. 20 capsule Trevor Iha, FNP   predniSONE (STERAPRED UNI-PAK 21 TAB) 10 MG (21) TBPK tablet Take by mouth daily. Take 6 tabs by mouth daily  for 2 days, then 5 tabs for 2 days, then 4 tabs for 2 days, then 3 tabs for 2 days, 2 tabs for  2 days, then 1 tab by mouth daily for 2 days 42 tablet Trevor Iha, FNP   fexofenadine [facility] ALLERGY) 180 MG tablet Take 1 tablet (180 mg total) by mouth daily for 15 days. 15 tablet Trevor Iha, FNP   HYDROcodone bit-homatropine (HYCODAN) 5-1.5 MG/5ML syrup Take 5 mLs by mouth every 8 (eight) hours as needed for  cough. 100 mL Trevor Iha, FNP      I have reviewed the PDMP during this encounter.   Trevor Iha, FNP 06/29/22 1112

## 2022-06-30 ENCOUNTER — Telehealth: Payer: Self-pay | Admitting: Emergency Medicine

## 2022-06-30 MED ORDER — BENZONATATE 200 MG PO CAPS: 200.0000 mg | ORAL_CAPSULE | Freq: Three times a day (TID) | ORAL | 0 refills | Status: AC | PRN

## 2022-06-30 NOTE — Telephone Encounter (Signed)
Patient requested Tessalon Perles for daytime cough.  Tessalon sent to requested pharmacy.

## 2022-06-30 NOTE — Telephone Encounter (Signed)
Called patient for follow up on her visit from yesterday at [facility]. Patient is taking her medications as directed. Patient state her cough is worst during the day and wanted a rx for Tessalon Pearles for daytime. Notified NP and rx was sent to the [facility] pharmacy on [address].

## 2022-08-12 ENCOUNTER — Ambulatory Visit
Admission: EM | Admit: 2022-08-12 | Discharge: 2022-08-12 | Disposition: A | Discharge status: Home / Self Care | Payer: BC Managed Care – PPO | Attending: Family Medicine | Admitting: Family Medicine

## 2022-08-12 DIAGNOSIS — J0191 Acute recurrent sinusitis, unspecified: Secondary | ICD-10-CM

## 2022-08-12 MED ORDER — CEFDINIR 300 MG PO CAPS: 300.0000 mg | ORAL_CAPSULE | Freq: Two times a day (BID) | ORAL | 0 refills | Status: DC

## 2022-08-12 MED ORDER — PREDNISONE 20 MG PO TABS: 40.0000 mg | ORAL_TABLET | Freq: Every day | ORAL | 0 refills | Status: DC

## 2022-08-12 NOTE — ED Triage Notes (Signed)
Pt presents with c/o cough that began last Wednesday.

## 2022-08-12 NOTE — Discharge Instructions (Signed)
Take cefdinir 2 times a day with food Drink lots of fluids Take prednisone daily for 5 days May take over-the-counter cough and cold medicine as needed See your doctor if not better by next week

## 2022-09-08 ENCOUNTER — Encounter: Payer: Self-pay | Admitting: Physician Assistant

## 2022-09-13 MED ORDER — BUPROPION HCL ER (SR) 100 MG PO TB12: 100.0000 mg | ORAL_TABLET | Freq: Two times a day (BID) | ORAL | 1 refills | Status: DC

## 2022-10-09 ENCOUNTER — Encounter: Payer: Self-pay | Admitting: Physician Assistant

## 2022-10-09 ENCOUNTER — Other Ambulatory Visit: Payer: Self-pay | Admitting: Physician Assistant

## 2022-11-04 ENCOUNTER — Encounter: Payer: Self-pay | Admitting: Physician Assistant

## 2022-11-04 ENCOUNTER — Ambulatory Visit: Payer: BC Managed Care – PPO | Admitting: Physician Assistant

## 2022-11-04 VITALS — BP 115/67 | HR 109 | Ht 64.0 in | Wt 216.0 lb

## 2022-11-04 DIAGNOSIS — R002 Palpitations: Secondary | ICD-10-CM | POA: Diagnosis not present

## 2022-11-04 DIAGNOSIS — R000 Tachycardia, unspecified: Secondary | ICD-10-CM | POA: Diagnosis not present

## 2022-11-04 DIAGNOSIS — F411 Generalized anxiety disorder: Secondary | ICD-10-CM

## 2022-11-04 DIAGNOSIS — E039 Hypothyroidism, unspecified: Secondary | ICD-10-CM | POA: Diagnosis not present

## 2022-11-04 DIAGNOSIS — M62838 Other muscle spasm: Secondary | ICD-10-CM

## 2022-11-04 MED ORDER — CYCLOBENZAPRINE HCL 10 MG PO TABS: 5.0000 mg | ORAL_TABLET | Freq: Three times a day (TID) | ORAL | 0 refills | Status: DC | PRN

## 2022-11-04 MED ORDER — METOPROLOL SUCCINATE ER 25 MG PO TB24: 25.0000 mg | ORAL_TABLET | Freq: Every day | ORAL | 0 refills | Status: DC

## 2022-11-04 NOTE — Patient Instructions (Addendum)
Will order heart monitor. Follow up after.   Palpitations Palpitations are feelings that your heartbeat is irregular or is faster than normal. It may feel like your heart is fluttering or skipping a beat. Palpitations may be caused by many things, including smoking, caffeine, alcohol, stress, and certain medicines or drugs. Most causes of palpitations are not serious.  However, some palpitations can be a sign of a serious problem. Further tests and a thorough medical history will be done to find the cause of your palpitations. Your provider may order tests such as an ECG, labs, an echocardiogram, or an ambulatory continuous ECG monitor. Follow these instructions at home: Pay attention to any changes in your symptoms. Let your health care provider know about them. Take these actions to help manage your symptoms: Eating and drinking Follow instructions from your health care provider about eating or drinking restrictions. You may need to avoid foods and drinks that may cause palpitations. These may include: Caffeinated coffee, tea, soft drinks, and energy drinks. Chocolate. Alcohol. Diet pills. Lifestyle     Take steps to reduce your stress and anxiety. Things that can help you relax include: Yoga. Mind-body activities, such as deep breathing, meditation, or using words and images to create positive thoughts (guided imagery). Physical activity, such as swimming, jogging, or walking. Tell your health care provider if your palpitations increase with activity. If you have chest pain or shortness of breath with activity, do not continue the activity until you are seen by your health care provider. Biofeedback. This is a method that helps you learn to use your mind to control things in your body, such as your heartbeat. Get plenty of rest and sleep. Keep a regular bed time. Do not use drugs, including cocaine or ecstasy. Do not use marijuana. Do not use any products that contain nicotine or tobacco.  These products include cigarettes, chewing tobacco, and vaping devices, such as e-cigarettes. If you need help quitting, ask your health care provider. General instructions Take over-the-counter and prescription medicines only as told by your health care provider. Keep all follow-up visits. This is important. These may include visits for further testing if palpitations do not go away or get worse. Contact a health care provider if: You continue to have a fast or irregular heartbeat for a long period of time. You notice that your palpitations occur more often. Get help right away if: You have chest pain or shortness of breath. You have a severe headache. You feel dizzy or you faint. These symptoms may represent a serious problem that is an emergency. Do not wait to see if the symptoms will go away. Get medical help right away. Call your local emergency services (911 in the [place] Do not drive yourself to the hospital. Summary Palpitations are feelings that your heartbeat is irregular or is faster than normal. It may feel like your heart is fluttering or skipping a beat. Palpitations may be caused by many things, including smoking, caffeine, alcohol, stress, certain medicines, and drugs. Further tests and a thorough medical history may be done to find the cause of your palpitations. Get help right away if you faint or have chest pain, shortness of breath, severe headache, or dizziness. This information is not intended to replace advice given to you by your health care provider. Make sure you discuss any questions you have with your health care provider. Document Revised: 01/17/2021 Document Reviewed: 01/17/2021 Elsevier Patient Education  [place].

## 2022-11-05 ENCOUNTER — Ambulatory Visit: Payer: BC Managed Care – PPO | Attending: Physician Assistant

## 2022-11-05 ENCOUNTER — Other Ambulatory Visit: Payer: Self-pay | Admitting: Neurology

## 2022-11-05 ENCOUNTER — Encounter: Payer: Self-pay | Admitting: Physician Assistant

## 2022-11-05 DIAGNOSIS — R002 Palpitations: Secondary | ICD-10-CM

## 2022-11-05 DIAGNOSIS — R000 Tachycardia, unspecified: Secondary | ICD-10-CM

## 2022-11-05 DIAGNOSIS — E039 Hypothyroidism, unspecified: Secondary | ICD-10-CM

## 2022-11-05 LAB — CBC WITH DIFFERENTIAL/PLATELET
Absolute Monocytes: 728 cells/uL (ref 200–950)
Basophils Absolute: 42 cells/uL (ref 0–200)
Basophils Relative: 0.4 %
Eosinophils Absolute: 94 cells/uL (ref 15–500)
Eosinophils Relative: 0.9 %
HCT: 38.3 % (ref 35.0–45.0)
Hemoglobin: 12.5 g/dL (ref 11.7–15.5)
Lymphs Abs: 2777 cells/uL (ref 850–3900)
MCH: 27.6 pg (ref 27.0–33.0)
MCHC: 32.6 g/dL (ref 32.0–36.0)
MCV: 84.5 fL (ref 80.0–100.0)
MPV: 10.4 fL (ref 7.5–12.5)
Monocytes Relative: 7 %
Neutro Abs: 6760 cells/uL (ref 1500–7800)
Neutrophils Relative %: 65 %
Platelets: 317 10*3/uL (ref 140–400)
RBC: 4.53 10*6/uL (ref 3.80–5.10)
RDW: 13 % (ref 11.0–15.0)
Total Lymphocyte: 26.7 %
WBC: 10.4 10*3/uL (ref 3.8–10.8)

## 2022-11-05 LAB — COMPLETE METABOLIC PANEL WITH GFR
AG Ratio: 1.8 (calc) (ref 1.0–2.5)
ALT: 16 U/L (ref 6–29)
AST: 12 U/L (ref 10–30)
Albumin: 4.4 g/dL (ref 3.6–5.1)
Alkaline phosphatase (APISO): 90 U/L (ref 31–125)
BUN: 9 mg/dL (ref 7–25)
CO2: 29 mmol/L (ref 20–32)
Calcium: 9.9 mg/dL (ref 8.6–10.2)
Chloride: 106 mmol/L (ref 98–110)
Creat: 0.7 mg/dL (ref 0.50–0.96)
Globulin: 2.4 g/dL (calc) (ref 1.9–3.7)
Glucose, Bld: 99 mg/dL (ref 65–99)
Potassium: 3.8 mmol/L (ref 3.5–5.3)
Sodium: 146 mmol/L (ref 135–146)
Total Bilirubin: 0.6 mg/dL (ref 0.2–1.2)
Total Protein: 6.8 g/dL (ref 6.1–8.1)
eGFR: 120 mL/min/{1.73_m2} (ref >=60)

## 2022-11-05 LAB — TSH+FREE T4: TSH W/REFLEX TO FT4: 0.46 mIU/L

## 2022-11-05 NOTE — Progress Notes (Unsigned)
Enrolled patient for a 14 day Zio XT monitor to be mailed to patients home  DOD to read

## 2022-11-05 NOTE — Progress Notes (Signed)
Established Patient Office Visit  Subjective   Patient ID: Lydia Gregory, female    DOB: 1991/07/17  Age: 32 y.o. MRN: [id number]  Chief Complaint  Patient presents with   Irregular Heart Beat    Resting heartrate 110    HPI Pt is a 32 yo female with hypothyroidism, GAD, MDD who presents to the clinic with resting heart rate at 110bpm. She has hx of this and was on propranolol but not helping anymore. She is stressed at work but does not feel like that is causing the elevation. At times she feels like her heart is "fluttering" or she is having "palpitations". She does take her synthroid daily with no concerns. She stopped her adderall and did not make a difference on resting heart rate. Via her watch her heart rate can go from 58 to 158 in a day.   .. Active Ambulatory Problems    Diagnosis Date Noted   Class 2 obesity due to excess calories without serious comorbidity with body mass index (BMI) of 39.0 to 39.9 in adult 08/20/2016   Vitamin D deficiency 08/20/2016   MDD (major depressive disorder), recurrent episode, moderate ([place]) 08/20/2016   GAD (generalized anxiety disorder) 08/20/2016   No energy 01/02/2017   Hypothyroidism 01/02/2017   Inattention 01/02/2017   Attention deficit hyperactivity disorder (ADHD), predominantly inattentive type 01/02/2017   Transaminitis 12/31/2017   Sinus tachycardia 05/25/2019   Abnormal weight gain 05/04/2021   Depressive disorder, not elsewhere classified 10/10/2009   Impaired mobility and ADLs 03/11/2022   Other fracture of right femur, initial encounter for closed fracture ([place]) 10/04/2021   Trauma 03/11/2022   Palpitation 11/05/2022   Resolved Ambulatory Problems    Diagnosis Date Noted   Back pain 01/12/2020   Pre-op evaluation 12/05/2020   Breast hypertrophy in female 12/05/2020   Encounter for removal and reinsertion of Nexplanon 01/31/2022   Breasts asymmetrical 08/01/2020   Past Medical History:  Diagnosis Date   ADD  (attention deficit disorder)    Anxiety and depression    Depressed    Renal disorder    Thyroid disease       ROS See HPI.    Objective:     BP 115/67 (BP Location: Left Arm, Patient Position: Sitting, Cuff Size: Normal)   Pulse (!) 109   Ht '5\' 4"'$  (1.626 m)   Wt 216 lb 0.6 oz (98 kg)   SpO2 100%   BMI 37.08 kg/m  BP Readings from Last 3 Encounters:  11/04/22 115/67  08/12/22 97/68  06/29/22 122/85   Wt Readings from Last 3 Encounters:  11/04/22 216 lb 0.6 oz (98 kg)  06/29/22 220 lb (99.8 kg)  04/19/22 231 lb (104.8 kg)    ..    11/04/2022    3:45 PM 04/19/2022    2:09 PM 03/11/2022    3:02 PM 05/04/2021   10:55 AM 05/25/2019    2:27 PM  Depression screen PHQ 2/9  Decreased Interest 1 1 0 3 0  Down, Depressed, Hopeless 0 0 1 0 0  PHQ - 2 Score '1 1 1 3 '$ 0  Altered sleeping  1 0 3 1  Tired, decreased energy  '2 2 3 2  '$ Change in appetite  '2 3 1 1  '$ Feeling bad or failure about yourself   0 0 0 0  Trouble concentrating  '1 2 2 2  '$ Moving slowly or fidgety/restless  0 0 0 0  Suicidal thoughts  0 0 0 0  PHQ-9  Score  '7 8 12 6  '$ Difficult doing work/chores  Not difficult at all Somewhat difficult Somewhat difficult Not difficult at all     Physical Exam Constitutional:      Appearance: Normal appearance. She is obese.  HENT:     Head: Normocephalic.  Cardiovascular:     Rate and Rhythm: Regular rhythm. Tachycardia present.  Pulmonary:     Effort: Pulmonary effort is normal.     Breath sounds: Normal breath sounds.  Musculoskeletal:     Cervical back: Normal range of motion and neck supple. No tenderness.     Right lower leg: No edema.     Left lower leg: No edema.  Lymphadenopathy:     Cervical: No cervical adenopathy.  Neurological:     General: No focal deficit present.     Mental Status: She is alert and oriented to person, place, and time.  Psychiatric:        Mood and Affect: Mood normal.          Assessment & Plan:  Marland KitchenMarland KitchenJosy was seen today for  irregular heart beat.  Diagnoses and all orders for this visit:  Sinus tachycardia -     TSH + free T4 -     CBC w/Diff/Platelet -     COMPLETE METABOLIC PANEL WITH GFR -     metoprolol succinate (TOPROL-XL) 25 MG 24 hr tablet; Take 1 tablet (25 mg total) by mouth daily. -     LONG TERM MONITOR (3-14 DAYS); Future  Palpitation -     TSH + free T4 -     CBC w/Diff/Platelet -     COMPLETE METABOLIC PANEL WITH GFR -     metoprolol succinate (TOPROL-XL) 25 MG 24 hr tablet; Take 1 tablet (25 mg total) by mouth daily. -     LONG TERM MONITOR (3-14 DAYS); Future  GAD (generalized anxiety disorder)  Acquired hypothyroidism  Muscle spasms of both lower extremities -     cyclobenzaprine (FLEXERIL) 10 MG tablet; Take 0.5-1 tablets (5-10 mg total) by mouth 3 (three) times daily as needed for muscle spasms.   Unclear etiology Will check tSH/CbC/CMP Will get heart monitor to track her elevated HR Discussed red flag of resting HR 220 minus age Stop propranolol and start metoprolol XR for better heart rate control Follow up after heart monitor results    Return for 4-6 weeks after results of heart monitor.    Iran Planas, PA-C

## 2022-11-05 NOTE — Progress Notes (Signed)
Rakia,  Kidney, liver, glucose are normal.  Hemoglobin is normal.  TSH in the normal range but closer to hyperthyroid side. I think you could decrease to 1 tablet daily for 4 weeks and recheck TSH.

## 2022-11-09 DIAGNOSIS — R000 Tachycardia, unspecified: Secondary | ICD-10-CM | POA: Diagnosis not present

## 2022-11-09 DIAGNOSIS — R002 Palpitations: Secondary | ICD-10-CM

## 2022-11-12 ENCOUNTER — Encounter: Payer: Self-pay | Admitting: Physician Assistant

## 2022-11-12 MED ORDER — TRIAMCINOLONE ACETONIDE 0.1 % EX CREA: 1.0000 | TOPICAL_CREAM | Freq: Two times a day (BID) | CUTANEOUS | 0 refills | Status: DC

## 2022-12-03 ENCOUNTER — Other Ambulatory Visit: Payer: Self-pay | Admitting: Physician Assistant

## 2022-12-04 NOTE — Progress Notes (Signed)
GREAT news on heart monitor. Patient triggered events you were in sinus rhythm. Very rare ectopic beats. No concerning features noted.

## 2022-12-24 ENCOUNTER — Telehealth: Payer: BC Managed Care – PPO | Admitting: Physician Assistant

## 2022-12-24 DIAGNOSIS — R3989 Other symptoms and signs involving the genitourinary system: Secondary | ICD-10-CM

## 2022-12-24 MED ORDER — NITROFURANTOIN MONOHYD MACRO 100 MG PO CAPS: 100.0000 mg | ORAL_CAPSULE | Freq: Two times a day (BID) | ORAL | 0 refills | Status: DC

## 2022-12-24 NOTE — Progress Notes (Signed)
E-Visit for Urinary Problems  We are sorry that you are not feeling well.  Here is how we plan to help!  Based on what you shared with me it looks like you most likely have a simple urinary tract infection.  A UTI (Urinary Tract Infection) is a bacterial infection of the bladder.  Most cases of urinary tract infections are simple to treat but a key part of your care is to encourage you to drink plenty of fluids and watch your symptoms carefully.  I have prescribed MacroBid 100 mg twice a day for 5 days.  Your symptoms should gradually improve. Call us if the burning in your urine worsens, you develop worsening fever, back pain or pelvic pain or if your symptoms do not resolve after completing the antibiotic.  Urinary tract infections can be prevented by drinking plenty of water to keep your body hydrated.  Also be sure when you wipe, wipe from front to back and don't hold it in!  If possible, empty your bladder every 4 hours.  HOME CARE Drink plenty of fluids Compete the full course of the antibiotics even if the symptoms resolve Remember, when you need to go.go. Holding in your urine can increase the likelihood of getting a UTI! GET HELP RIGHT AWAY IF: You cannot urinate You get a high fever Worsening back pain occurs You see blood in your urine You feel sick to your stomach or throw up You feel like you are going to pass out  MAKE SURE YOU  Understand these instructions. Will watch your condition. Will get help right away if you are not doing well or get worse.   Thank you for choosing an e-visit.  Your e-visit answers were reviewed by a board certified advanced clinical practitioner to complete your personal care plan. Depending upon the condition, your plan could have included both over the counter or prescription medications.  Please review your pharmacy choice. Make sure the pharmacy is open so you can pick up prescription now. If there is a problem, you may contact your  provider through MyChart messaging and have the prescription routed to another pharmacy.  Your safety is important to us. If you have drug allergies check your prescription carefully.   For the next 24 hours you can use MyChart to ask questions about today's visit, request a non-urgent call back, or ask for a work or school excuse. You will get an email in the next two days asking about your experience. I hope that your e-visit has been valuable and will speed your recovery.  I have spent 5 minutes in review of e-visit questionnaire, review and updating patient chart, medical decision making and response to patient.   Tami Blass M Daleah Coulson, PA-C  

## 2022-12-31 ENCOUNTER — Other Ambulatory Visit: Payer: Self-pay | Admitting: Physician Assistant

## 2023-01-09 ENCOUNTER — Other Ambulatory Visit: Payer: Self-pay | Admitting: Physician Assistant

## 2023-01-09 DIAGNOSIS — R002 Palpitations: Secondary | ICD-10-CM

## 2023-01-09 DIAGNOSIS — R000 Tachycardia, unspecified: Secondary | ICD-10-CM

## 2023-01-15 ENCOUNTER — Ambulatory Visit: Payer: BC Managed Care – PPO | Admitting: Physician Assistant

## 2023-01-15 ENCOUNTER — Encounter: Payer: Self-pay | Admitting: Physician Assistant

## 2023-01-15 VITALS — BP 103/72 | HR 100 | Ht 64.0 in | Wt 217.0 lb

## 2023-01-15 DIAGNOSIS — R000 Tachycardia, unspecified: Secondary | ICD-10-CM

## 2023-01-15 DIAGNOSIS — M62838 Other muscle spasm: Secondary | ICD-10-CM | POA: Diagnosis not present

## 2023-01-15 DIAGNOSIS — R002 Palpitations: Secondary | ICD-10-CM | POA: Diagnosis not present

## 2023-01-15 MED ORDER — CYCLOBENZAPRINE HCL 10 MG PO TABS: 5.0000 mg | ORAL_TABLET | Freq: Three times a day (TID) | ORAL | 0 refills | Status: AC | PRN

## 2023-01-15 MED ORDER — METOPROLOL SUCCINATE ER 25 MG PO TB24: 25.0000 mg | ORAL_TABLET | Freq: Every day | ORAL | 3 refills | Status: DC

## 2023-01-15 NOTE — Progress Notes (Signed)
Established Patient Office Visit  Subjective   Patient ID: Lydia Gregory, female    DOB: 04-Feb-1991  Age: 32 y.o. MRN: [id number]  No chief complaint on file.   HPI Pt is a 32 yo female who presents to the clinic to follow up on palpitations and sinus tachycardia. She was started on metoprolol. She had heart monitor done. She is feeling much better on metoprolol. No concerns or complaints. Palpitations have almost completely resolved.    Needs refills of flexeril for intermittent muscle spasms.  .. Active Ambulatory Problems    Diagnosis Date Noted   Class 2 obesity due to excess calories without serious comorbidity with body mass index (BMI) of 39.0 to 39.9 in adult 08/20/2016   Vitamin D deficiency 08/20/2016   MDD (major depressive disorder), recurrent episode, moderate ([facility]) 08/20/2016   GAD (generalized anxiety disorder) 08/20/2016   No energy 01/02/2017   Hypothyroidism 01/02/2017   Inattention 01/02/2017   Attention deficit hyperactivity disorder (ADHD), predominantly inattentive type 01/02/2017   Transaminitis 12/31/2017   Sinus tachycardia 05/25/2019   Abnormal weight gain 05/04/2021   Depressive disorder, not elsewhere classified 10/10/2009   Impaired mobility and ADLs 03/11/2022   Other fracture of right femur, initial encounter for closed fracture ([facility]) 10/04/2021   Trauma 03/11/2022   Palpitation 11/05/2022   Resolved Ambulatory Problems    Diagnosis Date Noted   Back pain 01/12/2020   Pre-op evaluation 12/05/2020   Breast hypertrophy in female 12/05/2020   Encounter for removal and reinsertion of Nexplanon 01/31/2022   Breasts asymmetrical 08/01/2020   Past Medical History:  Diagnosis Date   ADD (attention deficit disorder)    Anxiety and depression    Depressed    Renal disorder    Thyroid disease      ROS   See HPI.  Objective:     BP 103/72   Pulse 100   Ht 5\' 4"  (1.626 m)   Wt 217 lb (98.4 kg)   SpO2 97%   BMI 37.25 kg/m  BP  Readings from Last 3 Encounters:  01/15/23 103/72  11/04/22 115/67  08/12/22 97/68   Wt Readings from Last 3 Encounters:  01/15/23 217 lb (98.4 kg)  11/04/22 216 lb 0.6 oz (98 kg)  06/29/22 220 lb (99.8 kg)      Physical Exam Constitutional:      Appearance: Normal appearance. She is obese.  HENT:     Head: Normocephalic.  Cardiovascular:     Rate and Rhythm: Normal rate and regular rhythm.     Pulses: Normal pulses.     Heart sounds: Normal heart sounds.  Pulmonary:     Effort: Pulmonary effort is normal.  Neurological:     Mental Status: She is alert and oriented to person, place, and time.  Psychiatric:        Mood and Affect: Mood normal.         Assessment & Plan:  Marland KitchenMarland KitchenDiagnoses and all orders for this visit:  Palpitation -     metoprolol succinate (TOPROL-XL) 25 MG 24 hr tablet; Take 1 tablet (25 mg total) by mouth daily.  Sinus tachycardia -     metoprolol succinate (TOPROL-XL) 25 MG 24 hr tablet; Take 1 tablet (25 mg total) by mouth daily.  Muscle spasms of both lower extremities -     cyclobenzaprine (FLEXERIL) 10 MG tablet; Take 0.5-1 tablets (5-10 mg total) by mouth 3 (three) times daily as needed for muscle spasms.   Reviewed zio patch results  and reassured patient mostly in sinus rhythm with very rare ectopic beats.  Continue metoprolol  Vitals good today Flexeril refilled Follow up as needed  Tandy Gaw, PA-C

## 2023-02-02 ENCOUNTER — Other Ambulatory Visit: Payer: Self-pay | Admitting: Physician Assistant

## 2023-04-07 ENCOUNTER — Other Ambulatory Visit: Payer: Self-pay | Admitting: Physician Assistant

## 2023-04-07 DIAGNOSIS — E039 Hypothyroidism, unspecified: Secondary | ICD-10-CM

## 2023-07-16 ENCOUNTER — Other Ambulatory Visit: Payer: Self-pay | Admitting: Physician Assistant

## 2023-08-12 ENCOUNTER — Other Ambulatory Visit: Payer: Self-pay

## 2023-08-12 ENCOUNTER — Ambulatory Visit
Admission: EM | Admit: 2023-08-12 | Discharge: 2023-08-12 | Disposition: A | Discharge status: Home / Self Care | Payer: BC Managed Care – PPO | Attending: Family Medicine | Admitting: Family Medicine

## 2023-08-12 ENCOUNTER — Ambulatory Visit: Payer: BC Managed Care – PPO

## 2023-08-12 ENCOUNTER — Encounter: Payer: Self-pay | Admitting: Emergency Medicine

## 2023-08-12 DIAGNOSIS — S93402A Sprain of unspecified ligament of left ankle, initial encounter: Secondary | ICD-10-CM

## 2023-08-12 DIAGNOSIS — M25572 Pain in left ankle and joints of left foot: Secondary | ICD-10-CM

## 2023-08-12 DIAGNOSIS — S93492A Sprain of other ligament of left ankle, initial encounter: Secondary | ICD-10-CM

## 2023-08-12 NOTE — Discharge Instructions (Addendum)
Use ice and elevation to reduce pain and swelling Take Tylenol as needed Wear boot at all times that you are upright Follow with Dr. Benjamin Stain (sports medicine in [place] office) in 1 to 2 weeks

## 2023-08-12 NOTE — ED Provider Notes (Signed)
[facility]    CSN: [id number] Arrival date & time: 08/12/23  1647      History   Chief Complaint Chief Complaint  Patient presents with   Ankle Pain    HPI Lydia Gregory is a 32 y.o. female.   Patient states has had recurring ankle sprains.  Usually on the left side.  She states that she tripped day before yesterday and sprained her ankle.  She states that her ankle is swollen, purplish, painful.  She is here for x-rays.  She feels like her ankle sprains easily    Past Medical History:  Diagnosis Date   ADD (attention deficit disorder)    Anxiety and depression    Depressed    Renal disorder    Thyroid disease     Patient Active Problem List   Diagnosis Date Noted   Palpitation 11/05/2022   Impaired mobility and ADLs 03/11/2022   Trauma 03/11/2022   Other fracture of right femur, initial encounter for closed fracture ([facility]) 10/04/2021   Abnormal weight gain 05/04/2021   Sinus tachycardia 05/25/2019   Transaminitis 12/31/2017   No energy 01/02/2017   Hypothyroidism 01/02/2017   Inattention 01/02/2017   Attention deficit hyperactivity disorder (ADHD), predominantly inattentive type 01/02/2017   Class 2 obesity due to excess calories without serious comorbidity with body mass index (BMI) of 39.0 to 39.9 in adult 08/20/2016   Vitamin D deficiency 08/20/2016   MDD (major depressive disorder), recurrent episode, moderate ([facility]) 08/20/2016   GAD (generalized anxiety disorder) 08/20/2016   Depressive disorder, not elsewhere classified 10/10/2009    Past Surgical History:  Procedure Laterality Date   CYSTOSCOPY KIDNEY W/ URETERAL GUIDE WIRE     KIDNEY STONE SURGERY     WISDOM TOOTH EXTRACTION      OB History   No obstetric history on file.      Home Medications    Prior to Admission medications   Medication Sig Start Date End Date Taking? Authorizing Provider  ALPRAZolam (XANAX) 0.5 MG tablet TAKE 1 TABLET BY MOUTH TWICE A DAY AS NEEDED FOR  ANXIETY 10/12/18  Yes Breeback, Jade L, PA-C  amphetamine-dextroamphetamine (ADDERALL) 10 MG tablet Take 10 mg by mouth 2 (two) times daily. 01/23/22  Yes [provider]  BD PEN NEEDLE NANO 2ND GEN 32G X 4 MM MISC USE WITH SAXENDA PEN DAILY. 06/28/22  Yes Breeback, Jade L, PA-C  buPROPion ER (WELLBUTRIN SR) 100 MG 12 hr tablet TAKE 1 TABLET BY MOUTH TWICE A DAY 07/18/23  Yes Breeback, Jade L, PA-C  cloNIDine HCl (KAPVAY) 0.1 MG TB12 ER tablet Take 2 tablets by mouth at bedtime.   Yes [provider]  cyclobenzaprine (FLEXERIL) 10 MG tablet Take 0.5-1 tablets (5-10 mg total) by mouth 3 (three) times daily as needed for muscle spasms. 01/15/23  Yes Jomarie Longs, PA-C  etonogestrel (NEXPLANON) 68 MG IMPL implant Inserted 12/31/17 Lot# [id number] 12/31/17  Yes Sunnie Nielsen, DO  levothyroxine (SYNTHROID) 125 MCG tablet 1 TAB BY MOUTH MON THRU FRI. 1.5 TAB SAT AND SUN TAKE ON EMPTY STOMACH 30 MIN BEFORE FOOD/OTHER MEDS 04/07/23  Yes Breeback, Jade L, PA-C  metoprolol succinate (TOPROL-XL) 25 MG 24 hr tablet Take 1 tablet (25 mg total) by mouth daily. 01/15/23  Yes Breeback, Jade L, PA-C  Probiotic Product (PROBIOTIC-10 PO) Take by mouth daily.   Yes [provider]  RESTASIS 0.05 % ophthalmic emulsion 1 drop 2 (two) times daily. 12/03/21  Yes [provider]  Family History Family History  Problem Relation Age of Onset   Diabetes Mother    Hypertension Mother    Hypertension Father     Social History Social History   Tobacco Use   Smoking status: Never   Smokeless tobacco: Never  Vaping Use   Vaping status: Never Used  Substance Use Topics   Alcohol use: Not Currently   Drug use: No     Allergies   Amoxicillin, Codeine, Penicillins, Septra [sulfamethoxazole-trimethoprim], and Sulfa antibiotics   Review of Systems Review of Systems See HPI  Physical Exam Triage Vital Signs ED Triage Vitals  Encounter Vitals Group     BP 08/12/23 1722 (!)  143/93     Systolic BP Percentile --      Diastolic BP Percentile --      Pulse Rate 08/12/23 1722 98     Resp 08/12/23 1722 16     Temp 08/12/23 1722 98.3 F (36.8 C)     Temp Source 08/12/23 1722 Oral     SpO2 08/12/23 1722 100 %     Weight 08/12/23 1723 220 lb (99.8 kg)     Height 08/12/23 1723 5\' 3"  (1.6 m)     Head Circumference --      Peak Flow --      Pain Score 08/12/23 1723 6     Pain Loc --      Pain Education --      Exclude from Growth Chart --    No data found.  Updated Vital Signs BP (!) 143/93 (BP Location: Left Arm)   Pulse 98   Temp 98.3 F (36.8 C) (Oral)   Resp 16   Ht 5\' 3"  (1.6 m)   Wt 99.8 kg   SpO2 100%   BMI 38.97 kg/m  Physical Exam Constitutional:      General: She is not in acute distress.    Appearance: She is well-developed.  HENT:     Head: Normocephalic and atraumatic.  Eyes:     Conjunctiva/sclera: Conjunctivae normal.     Pupils: Pupils are equal, round, and reactive to light.  Cardiovascular:     Rate and Rhythm: Normal rate.  Pulmonary:     Effort: Pulmonary effort is normal. No respiratory distress.  Abdominal:     General: There is no distension.     Palpations: Abdomen is soft.  Musculoskeletal:        General: Swelling, tenderness and signs of injury present. Normal range of motion.     Cervical back: Normal range of motion.     Comments: Left ankle as marked swelling laterally.  Purple discoloration across lateral foot.  Tenderness to the ATFL and distal fibula.  Limited range of motion secondary to pain.  Could not establish laxity  Skin:    General: Skin is warm and dry.     Findings: Bruising present.  Neurological:     Mental Status: She is alert.      UC Treatments / Results  Labs (all labs ordered are listed, but only abnormal results are displayed) Labs Reviewed - No data to display  EKG   Radiology DG Ankle Complete Left  Result Date: 08/12/2023 CLINICAL DATA:  Left ankle pain after fall yesterday.  EXAM: LEFT ANKLE COMPLETE - 3+ VIEW COMPARISON:  None Available. FINDINGS: There is no evidence of fracture, dislocation, or joint effusion. There is no evidence of arthropathy or other focal bone abnormality. Lateral soft tissue swelling is noted. IMPRESSION: No fracture or  dislocation.  Lateral soft tissue swelling. Electronically Signed   By: Lupita Raider M.D.   On: 08/12/2023 18:45    Procedures Procedures (including critical care time)  Medications Ordered in UC Medications - No data to display  Initial Impression / Assessment and Plan / UC Course  I have reviewed the triage vital signs and the nursing notes.  Pertinent labs & imaging results that were available during my care of the patient were reviewed by me and considered in my medical decision making (see chart for details).     Concern with repeated sprains that she may have ankle laxity and is at risk for additional injury.  I will put her in a boot.  Have her follow-up with sports medicine.  I suspect she needs physical therapy for strengthening Final Clinical Impressions(s) / UC Diagnoses   Final diagnoses:  Sprain of anterior talofibular ligament of left ankle, initial encounter  Inversion sprain of ankle, left, initial encounter     Discharge Instructions      Use ice and elevation to reduce pain and swelling Take Tylenol as needed Wear boot at all times that you are upright Follow with Dr. Benjamin Stain (sports medicine in [place] office) in 1 to 2 weeks   ED Prescriptions   None    PDMP not reviewed this encounter.   Eustace Moore, MD 08/12/23 [phone]

## 2023-08-12 NOTE — ED Triage Notes (Signed)
Patient states that she fell yesterday, now her left ankle is swollen and bruised.  Patient has taken Tylenol for pain.

## 2023-08-13 LAB — LIPID PANEL
HDL: 35 (ref 35–70)
LDL Cholesterol: 222

## 2023-08-14 ENCOUNTER — Encounter: Payer: Self-pay | Admitting: Physician Assistant

## 2023-09-12 ENCOUNTER — Encounter: Payer: Self-pay | Admitting: Physician Assistant

## 2023-09-22 ENCOUNTER — Telehealth: Payer: 59 | Admitting: Physician Assistant

## 2023-09-24 ENCOUNTER — Encounter: Payer: Self-pay | Admitting: Physician Assistant

## 2023-09-24 ENCOUNTER — Telehealth (INDEPENDENT_AMBULATORY_CARE_PROVIDER_SITE_OTHER): Payer: 59 | Admitting: Physician Assistant

## 2023-09-24 VITALS — Temp 98.5°F | Ht 64.0 in | Wt 215.0 lb

## 2023-09-24 DIAGNOSIS — R000 Tachycardia, unspecified: Secondary | ICD-10-CM | POA: Diagnosis not present

## 2023-09-24 DIAGNOSIS — E6609 Other obesity due to excess calories: Secondary | ICD-10-CM

## 2023-09-24 DIAGNOSIS — Z6839 Body mass index (BMI) 39.0-39.9, adult: Secondary | ICD-10-CM

## 2023-09-24 DIAGNOSIS — E782 Mixed hyperlipidemia: Secondary | ICD-10-CM

## 2023-09-24 DIAGNOSIS — E039 Hypothyroidism, unspecified: Secondary | ICD-10-CM | POA: Diagnosis not present

## 2023-09-24 DIAGNOSIS — F900 Attention-deficit hyperactivity disorder, predominantly inattentive type: Secondary | ICD-10-CM

## 2023-09-24 DIAGNOSIS — R002 Palpitations: Secondary | ICD-10-CM | POA: Diagnosis not present

## 2023-09-24 DIAGNOSIS — E66812 Obesity, class 2: Secondary | ICD-10-CM

## 2023-09-24 DIAGNOSIS — Z79899 Other long term (current) drug therapy: Secondary | ICD-10-CM

## 2023-09-24 MED ORDER — METOPROLOL SUCCINATE ER 25 MG PO TB24: 25.0000 mg | ORAL_TABLET | Freq: Every day | ORAL | 3 refills | Status: AC

## 2023-09-24 MED ORDER — ATORVASTATIN CALCIUM 40 MG PO TABS: 40.0000 mg | ORAL_TABLET | Freq: Every day | ORAL | 1 refills | Status: DC

## 2023-09-24 MED ORDER — BUPROPION HCL ER (SR) 100 MG PO TB12: 100.0000 mg | ORAL_TABLET | Freq: Two times a day (BID) | ORAL | 1 refills | Status: DC

## 2023-09-24 NOTE — Progress Notes (Signed)
 ..Virtual Visit via Video Note  I connected with Lydia Gregory on 09/24/23 at  7:10 AM EST by a video enabled telemedicine application and verified that I am speaking with the correct person using two identifiers.  Location: Patient: home Provider: clinic  .Lydia AasParticipating in visit:  Patient: Lydia Gregory  Provider: Sandy Crumb PA-C Provider in training: Enrigue Harvard PA-S   I discussed the limitations of evaluation and management by telemedicine and the availability of in person appointments. The patient expressed understanding and agreed to proceed.  History of Present Illness: Pt is a 33 yo female who presents to the clinic to discuss elevated LDL. Her dermatologist has been checking because of being on accutane. She is expected to be on this medication for at least another 2 months. LDL in 2023 was 129 and increased to 222.   Pt is doing ok. No concerns. She does need metoprolol  and wellbutrin  refilled as well   .Lydia Gregory Active Ambulatory Problems    Diagnosis Date Noted   Class 2 obesity due to excess calories without serious comorbidity with body mass index (BMI) of 39.0 to 39.9 in adult 08/20/2016   Vitamin D  deficiency 08/20/2016   MDD (major depressive disorder), recurrent episode, moderate ([facility]) 08/20/2016   GAD (generalized anxiety disorder) 08/20/2016   No energy 01/02/2017   Hypothyroidism 01/02/2017   Inattention 01/02/2017   Attention deficit hyperactivity disorder (ADHD), predominantly inattentive type 01/02/2017   Transaminitis 12/31/2017   Sinus tachycardia 05/25/2019   Abnormal weight gain 05/04/2021   Depressive disorder, not elsewhere classified 10/10/2009   Impaired mobility and ADLs 03/11/2022   Other fracture of right femur, initial encounter for closed fracture ([facility]) 10/04/2021   Trauma 03/11/2022   Palpitation 11/05/2022   Resolved Ambulatory Problems    Diagnosis Date Noted   Back pain 01/12/2020   Pre-op evaluation 12/05/2020   Breast hypertrophy in  female 12/05/2020   Encounter for removal and reinsertion of Nexplanon  01/31/2022   Breasts asymmetrical 08/01/2020   Past Medical History:  Diagnosis Date   ADD (attention deficit disorder)    Anxiety and depression    Depressed    Renal disorder    Thyroid  disease     Observations/Objective: No acute distress Normal mood and appearance   Assessment and Plan: Lydia AasAaron AasShavelle was seen today for medical management of chronic issues.  Diagnoses and all orders for this visit:  Mixed hyperlipidemia -     atorvastatin  (LIPITOR) 40 MG tablet; Take 1 tablet (40 mg total) by mouth daily. -     Lipid panel  Sinus tachycardia -     metoprolol  succinate (TOPROL -XL) 25 MG 24 hr tablet; Take 1 tablet (25 mg total) by mouth daily.  Palpitation -     metoprolol  succinate (TOPROL -XL) 25 MG 24 hr tablet; Take 1 tablet (25 mg total) by mouth daily.  Acquired hypothyroidism -     TSH + free T4  Medication management -     Lipid panel -     CMP14+EGFR -     TSH + free T4  Class 2 obesity due to excess calories without serious comorbidity with body mass index (BMI) of 39.0 to 39.9 in adult -     buPROPion  ER (WELLBUTRIN  SR) 100 MG 12 hr tablet; Take 1 tablet (100 mg total) by mouth 2 (two) times daily.  Attention deficit hyperactivity disorder (ADHD), predominantly inattentive type -     buPROPion  ER (WELLBUTRIN  SR) 100 MG 12 hr tablet; Take 1 tablet (100 mg total) by  mouth 2 (two) times daily.   LDL increased from 129 to 409 on accutane Start lipitor, discussed side effects Recheck in 3 months Expected duration of accutane is 2 more months Discussed diet  Encouraged regular exercise 150 minutes a week  Refilled wellbutrin , metoprolol   TSH ordered for hypothyroidism follow up  Encouraged patient to schedule pap smear   Follow Up Instructions:    I discussed the assessment and treatment plan with the patient. The patient was provided an opportunity to ask questions and all were  answered. The patient agreed with the plan and demonstrated an understanding of the instructions.   The patient was advised to call back or seek an in-person evaluation if the symptoms worsen or if the condition fails to improve as anticipated.   Lydia Nielson, PA-C

## 2023-11-28 ENCOUNTER — Encounter: Payer: Self-pay | Admitting: Physician Assistant

## 2023-11-28 LAB — CMP14+EGFR
ALT: 40 IU/L — ABNORMAL HIGH (ref 0–32)
AST: 18 IU/L (ref 0–40)
Albumin: 4.3 g/dL (ref 4.0–5.0)
Alkaline Phosphatase: 172 IU/L — ABNORMAL HIGH (ref 44–121)
BUN/Creatinine Ratio: 16 (ref 9–23)
BUN: 14 mg/dL (ref 6–20)
Bilirubin Total: 1 mg/dL (ref 0.0–1.2)
CO2: 22 mmol/L (ref 20–29)
Calcium: 10 mg/dL (ref 8.7–10.2)
Chloride: 103 mmol/L (ref 96–106)
Creatinine, Ser: 0.88 mg/dL (ref 0.57–1.00)
Globulin, Total: 2.4 g/dL (ref 1.5–4.5)
Glucose: 102 mg/dL — ABNORMAL HIGH (ref 70–99)
Potassium: 4.3 mmol/L (ref 3.5–5.2)
Sodium: 139 mmol/L (ref 134–144)
Total Protein: 6.7 g/dL (ref 6.0–8.5)
eGFR: 91 mL/min/{1.73_m2} (ref >59)

## 2023-11-28 LAB — LIPID PANEL
Chol/HDL Ratio: 2.8 ratio (ref 0.0–4.4)
Cholesterol, Total: 150 mg/dL (ref 100–199)
HDL: 54 mg/dL (ref >39)
LDL Chol Calc (NIH): 74 mg/dL (ref 0–99)
Triglycerides: 126 mg/dL (ref 0–149)
VLDL Cholesterol Cal: 22 mg/dL (ref 5–40)

## 2023-11-28 LAB — TSH+FREE T4
Free T4: 1.53 ng/dL (ref 0.82–1.77)
TSH: 1.05 u[IU]/mL (ref 0.450–4.500)

## 2023-11-28 NOTE — Progress Notes (Signed)
 Para,   Cholesterol looks great. Continue lipitor.  Thyroid looks great.  Glucose a little elevated. I would like to add A1C to labs.  Alk phosphatase is elevated with ALT. Any new medications?  Recheck in 6-8 weeks.

## 2023-12-18 ENCOUNTER — Telehealth: Admitting: Physician Assistant

## 2023-12-18 DIAGNOSIS — B9689 Other specified bacterial agents as the cause of diseases classified elsewhere: Secondary | ICD-10-CM

## 2023-12-18 DIAGNOSIS — J208 Acute bronchitis due to other specified organisms: Secondary | ICD-10-CM | POA: Diagnosis not present

## 2023-12-18 MED ORDER — BENZONATATE 100 MG PO CAPS: 100.0000 mg | ORAL_CAPSULE | Freq: Three times a day (TID) | ORAL | 0 refills | Status: AC | PRN

## 2023-12-18 MED ORDER — AZITHROMYCIN 250 MG PO TABS: ORAL_TABLET | ORAL | 0 refills | Status: AC

## 2023-12-18 NOTE — Progress Notes (Signed)
 Message sent to patient requesting further input regarding current symptoms. Awaiting patient response.

## 2023-12-18 NOTE — Progress Notes (Signed)
 I have spent 5 minutes in review of e-visit questionnaire, review and updating patient chart, medical decision making and response to patient.   Piedad Climes, PA-C

## 2023-12-18 NOTE — Progress Notes (Signed)
E-Visit for Cough  We are sorry that you are not feeling well.  Here is how we plan to help!  Based on your presentation I believe you most likely have A cough due to bacteria.  When patients have a productive cough with a change in color or increased sputum production, we are concerned about bacterial bronchitis.  If left untreated it can progress to pneumonia.  If your symptoms do not improve with your treatment plan it is important that you contact your provider.   I have prescribed Azithromyin 250 mg: two tablets now and then one tablet daily for 4 additonal days    In addition you may use A prescription cough medication called Tessalon Perles 100mg. You may take 1-2 capsules every 8 hours as needed for your cough.  From your responses in the eVisit questionnaire you describe inflammation in the upper respiratory tract which is causing a significant cough.  This is commonly called Bronchitis and has four common causes:   Allergies Viral Infections Acid Reflux Bacterial Infection Allergies, viruses and acid reflux are treated by controlling symptoms or eliminating the cause. An example might be a cough caused by taking certain blood pressure medications. You stop the cough by changing the medication. Another example might be a cough caused by acid reflux. Controlling the reflux helps control the cough.  USE OF BRONCHODILATOR ("RESCUE") INHALERS: There is a risk from using your bronchodilator too frequently.  The risk is that over-reliance on a medication which only relaxes the muscles surrounding the breathing tubes can reduce the effectiveness of medications prescribed to reduce swelling and congestion of the tubes themselves.  Although you feel brief relief from the bronchodilator inhaler, your asthma may actually be worsening with the tubes becoming more swollen and filled with mucus.  This can delay other crucial treatments, such as oral steroid medications. If you need to use a bronchodilator  inhaler daily, several times per day, you should discuss this with your provider.  There are probably better treatments that could be used to keep your asthma under control.     HOME CARE Only take medications as instructed by your medical team. Complete the entire course of an antibiotic. Drink plenty of fluids and get plenty of rest. Avoid close contacts especially the very young and the elderly Cover your mouth if you cough or cough into your sleeve. Always remember to wash your hands A steam or ultrasonic humidifier can help congestion.   GET HELP RIGHT AWAY IF: You develop worsening fever. You become short of breath You cough up blood. Your symptoms persist after you have completed your treatment plan MAKE SURE YOU  Understand these instructions. Will watch your condition. Will get help right away if you are not doing well or get worse.    Thank you for choosing an e-visit.  Your e-visit answers were reviewed by a board certified advanced clinical practitioner to complete your personal care plan. Depending upon the condition, your plan could have included both over the counter or prescription medications.  Please review your pharmacy choice. Make sure the pharmacy is open so you can pick up prescription now. If there is a problem, you may contact your provider through MyChart messaging and have the prescription routed to another pharmacy.  Your safety is important to us. If you have drug allergies check your prescription carefully.   For the next 24 hours you can use MyChart to ask questions about today's visit, request a non-urgent call back, or ask   for a work or school excuse. You will get an email in the next two days asking about your experience. I hope that your e-visit has been valuable and will speed your recovery.  

## 2024-01-01 ENCOUNTER — Encounter: Payer: Self-pay | Admitting: Physician Assistant

## 2024-01-14 MED ORDER — TRIAMCINOLONE ACETONIDE 0.1 % EX CREA: 1.0000 | TOPICAL_CREAM | Freq: Two times a day (BID) | CUTANEOUS | 0 refills | Status: AC

## 2024-01-23 ENCOUNTER — Encounter: Payer: Self-pay | Admitting: Physician Assistant

## 2024-03-23 ENCOUNTER — Other Ambulatory Visit: Payer: Self-pay | Admitting: Physician Assistant

## 2024-03-23 DIAGNOSIS — E782 Mixed hyperlipidemia: Secondary | ICD-10-CM

## 2024-04-14 ENCOUNTER — Other Ambulatory Visit: Payer: Self-pay | Admitting: Physician Assistant

## 2024-04-14 DIAGNOSIS — F900 Attention-deficit hyperactivity disorder, predominantly inattentive type: Secondary | ICD-10-CM

## 2024-04-14 DIAGNOSIS — Z6839 Body mass index (BMI) 39.0-39.9, adult: Secondary | ICD-10-CM

## 2024-06-17 ENCOUNTER — Encounter: Payer: Self-pay | Admitting: Physician Assistant

## 2024-09-17 ENCOUNTER — Other Ambulatory Visit: Payer: Self-pay | Admitting: Physician Assistant

## 2024-09-17 DIAGNOSIS — E782 Mixed hyperlipidemia: Secondary | ICD-10-CM
